# Patient Record
Sex: Female | Born: 1975 | Race: White | Hispanic: No | Marital: Married | Smoking: Never smoker
Health system: Southern US, Community
[De-identification: ages and names within clinical notes are randomized; demographics above are authoritative.]

## PROBLEM LIST (undated history)

## (undated) DIAGNOSIS — E611 Iron deficiency: Secondary | ICD-10-CM

## (undated) DIAGNOSIS — E349 Endocrine disorder, unspecified: Secondary | ICD-10-CM

## (undated) DIAGNOSIS — F419 Anxiety disorder, unspecified: Secondary | ICD-10-CM

## (undated) DIAGNOSIS — K219 Gastro-esophageal reflux disease without esophagitis: Secondary | ICD-10-CM

## (undated) DIAGNOSIS — G454 Transient global amnesia: Secondary | ICD-10-CM

## (undated) DIAGNOSIS — D649 Anemia, unspecified: Secondary | ICD-10-CM

## (undated) HISTORY — DX: Anemia, unspecified: D64.9

## (undated) HISTORY — DX: Gastro-esophageal reflux disease without esophagitis: K21.9

## (undated) HISTORY — PX: TONSILLECTOMY AND ADENOIDECTOMY: SHX28

## (undated) HISTORY — DX: Anxiety disorder, unspecified: F41.9

## (undated) HISTORY — DX: Endocrine disorder, unspecified: E34.9

## (undated) HISTORY — PX: OTHER SURGICAL HISTORY: SHX169

## (undated) HISTORY — DX: Transient global amnesia: G45.4

## (undated) HISTORY — DX: Iron deficiency: E61.1

---

## 2000-01-17 ENCOUNTER — Other Ambulatory Visit: Admission: RE | Admit: 2000-01-17 | Discharge: 2000-01-17 | Payer: Self-pay | Admitting: Internal Medicine

## 2001-03-07 ENCOUNTER — Other Ambulatory Visit: Admission: RE | Admit: 2001-03-07 | Discharge: 2001-03-07 | Payer: Self-pay | Admitting: *Deleted

## 2008-02-19 ENCOUNTER — Other Ambulatory Visit: Admission: RE | Admit: 2008-02-19 | Discharge: 2008-02-19 | Payer: Self-pay | Admitting: Obstetrics & Gynecology

## 2011-07-25 ENCOUNTER — Other Ambulatory Visit (HOSPITAL_COMMUNITY): Payer: Self-pay | Admitting: Family Medicine

## 2011-07-25 DIAGNOSIS — R140 Abdominal distension (gaseous): Secondary | ICD-10-CM

## 2011-07-25 DIAGNOSIS — R1011 Right upper quadrant pain: Secondary | ICD-10-CM

## 2011-08-02 ENCOUNTER — Encounter (HOSPITAL_COMMUNITY)
Admission: RE | Admit: 2011-08-02 | Discharge: 2011-08-02 | Disposition: A | Discharge status: Home / Self Care | Payer: Managed Care, Other (non HMO) | Source: Ambulatory Visit | Attending: Family Medicine | Admitting: Family Medicine

## 2011-08-02 DIAGNOSIS — R140 Abdominal distension (gaseous): Secondary | ICD-10-CM

## 2011-08-02 DIAGNOSIS — R142 Eructation: Secondary | ICD-10-CM | POA: Insufficient documentation

## 2011-08-02 DIAGNOSIS — R141 Gas pain: Secondary | ICD-10-CM | POA: Insufficient documentation

## 2011-08-02 DIAGNOSIS — R1011 Right upper quadrant pain: Secondary | ICD-10-CM

## 2011-08-02 MED ORDER — TECHNETIUM TC 99M MEBROFENIN IV KIT
5.3000 | PACK | Freq: Once | INTRAVENOUS | Status: AC | PRN
  Administered 2011-08-02: 5.3 via INTRAVENOUS

## 2011-08-02 MED ORDER — SINCALIDE 5 MCG IJ SOLR: 0.0200 ug/kg | Freq: Once | INTRAMUSCULAR | Status: DC

## 2013-02-25 ENCOUNTER — Ambulatory Visit: Payer: Self-pay

## 2013-03-03 ENCOUNTER — Ambulatory Visit: Payer: Managed Care, Other (non HMO) | Admitting: Nurse Practitioner

## 2013-03-05 ENCOUNTER — Encounter: Payer: Self-pay | Admitting: Nurse Practitioner

## 2013-03-05 ENCOUNTER — Ambulatory Visit (INDEPENDENT_AMBULATORY_CARE_PROVIDER_SITE_OTHER): Payer: Managed Care, Other (non HMO) | Admitting: Nurse Practitioner

## 2013-03-05 VITALS — BP 90/54 | HR 64 | Temp 98.4°F | Resp 16 | Ht 62.0 in | Wt 116.0 lb

## 2013-03-05 DIAGNOSIS — Z0000 Encounter for general adult medical examination without abnormal findings: Secondary | ICD-10-CM

## 2013-03-05 DIAGNOSIS — Z8639 Personal history of other endocrine, nutritional and metabolic disease: Secondary | ICD-10-CM

## 2013-03-05 DIAGNOSIS — Z862 Personal history of diseases of the blood and blood-forming organs and certain disorders involving the immune mechanism: Secondary | ICD-10-CM

## 2013-03-05 NOTE — Patient Instructions (Signed)
Our office will call you with lab results and any follow up. You look great! Pleasure to meet you!  Preventive Care for Adults, Female A healthy lifestyle and preventive care can promote health and wellness. Preventive health guidelines for women include the following key practices.  A routine yearly physical is a good way to check with your caregiver about your health and preventive screening. It is a chance to share any concerns and updates on your health, and to receive a thorough exam.  Visit your dentist for a routine exam and preventive care every 6 months. Brush your teeth twice a day and floss once a day. Good oral hygiene prevents tooth decay and gum disease.  The frequency of eye exams is based on your age, health, family medical history, use of contact lenses, and other factors. Follow your caregiver's recommendations for frequency of eye exams.  Eat a healthy diet. Foods like vegetables, fruits, whole grains, low-fat dairy products, and lean protein foods contain the nutrients you need without too many calories. Decrease your intake of foods high in solid fats, added sugars, and salt. Eat the right amount of calories for you.Get information about a proper diet from your caregiver, if necessary.  Regular physical exercise is one of the most important things you can do for your health. Most adults should get at least 150 minutes of moderate-intensity exercise (any activity that increases your heart rate and causes you to sweat) each week. In addition, most adults need muscle-strengthening exercises on 2 or more days a week.  Maintain a healthy weight. The body mass index (BMI) is a screening tool to identify possible weight problems. It provides an estimate of body fat based on height and weight. Your caregiver can help determine your BMI, and can help you achieve or maintain a healthy weight.For adults 20 years and older:  A BMI below 18.5 is considered underweight.  A BMI of 18.5 to  24.9 is normal.  A BMI of 25 to 29.9 is considered overweight.  A BMI of 30 and above is considered obese.  Maintain normal blood lipids and cholesterol levels by exercising and minimizing your intake of saturated fat. Eat a balanced diet with plenty of fruit and vegetables. Blood tests for lipids and cholesterol should begin at age 98 and be repeated every 5 years. If your lipid or cholesterol levels are high, you are over 50, or you are at high risk for heart disease, you may need your cholesterol levels checked more frequently.Ongoing high lipid and cholesterol levels should be treated with medicines if diet and exercise are not effective.  If you smoke, find out from your caregiver how to quit. If you do not use tobacco, do not start.  If you are pregnant, do not drink alcohol. If you are breastfeeding, be very cautious about drinking alcohol. If you are not pregnant and choose to drink alcohol, do not exceed 1 drink per day. One drink is considered to be 12 ounces (355 mL) of beer, 5 ounces (148 mL) of wine, or 1.5 ounces (44 mL) of liquor.  Avoid use of street drugs. Do not share needles with anyone. Ask for help if you need support or instructions about stopping the use of drugs.  High blood pressure causes heart disease and increases the risk of stroke. Your blood pressure should be checked at least every 1 to 2 years. Ongoing high blood pressure should be treated with medicines if weight loss and exercise are not effective.  If  you are 95 to 35 years old, ask your caregiver if you should take aspirin to prevent strokes.  Diabetes screening involves taking a blood sample to check your fasting blood sugar level. This should be done once every 3 years, after age 21, if you are within normal weight and without risk factors for diabetes. Testing should be considered at a younger age or be carried out more frequently if you are overweight and have at least 1 risk factor for diabetes.  Breast  cancer screening is essential preventive care for women. You should practice "breast self-awareness." This means understanding the normal appearance and feel of your breasts and may include breast self-examination. Any changes detected, no matter how small, should be reported to a caregiver. Women in their 77s and 30s should have a clinical breast exam (CBE) by a caregiver as part of a regular health exam every 1 to 3 years. After age 75, women should have a CBE every year. Starting at age 14, women should consider having a mammography (breast X-ray test) every year. Women who have a family history of breast cancer should talk to their caregiver about genetic screening. Women at a high risk of breast cancer should talk to their caregivers about having magnetic resonance imaging (MRI) and a mammography every year.  The Pap test is a screening test for cervical cancer. A Pap test can show cell changes on the cervix that might become cervical cancer if left untreated. A Pap test is a procedure in which cells are obtained and examined from the lower end of the uterus (cervix).  Women should have a Pap test starting at age 50.  Between ages 58 and 38, Pap tests should be repeated every 2 years.  Beginning at age 31, you should have a Pap test every 3 years as long as the past 3 Pap tests have been normal.  Some women have medical problems that increase the chance of getting cervical cancer. Talk to your caregiver about these problems. It is especially important to talk to your caregiver if a new problem develops soon after your last Pap test. In these cases, your caregiver may recommend more frequent screening and Pap tests.  The above recommendations are the same for women who have or have not gotten the vaccine for human papillomavirus (HPV).  If you had a hysterectomy for a problem that was not cancer or a condition that could lead to cancer, then you no longer need Pap tests. Even if you no longer need  a Pap test, a regular exam is a good idea to make sure no other problems are starting.  If you are between ages 28 and 43, and you have had normal Pap tests going back 10 years, you no longer need Pap tests. Even if you no longer need a Pap test, a regular exam is a good idea to make sure no other problems are starting.  If you have had past treatment for cervical cancer or a condition that could lead to cancer, you need Pap tests and screening for cancer for at least 20 years after your treatment.  If Pap tests have been discontinued, risk factors (such as a new sexual partner) need to be reassessed to determine if screening should be resumed.  The HPV test is an additional test that may be used for cervical cancer screening. The HPV test looks for the virus that can cause the cell changes on the cervix. The cells collected during the Pap test can  be tested for HPV. The HPV test could be used to screen women aged 21 years and older, and should be used in women of any age who have unclear Pap test results. After the age of 63, women should have HPV testing at the same frequency as a Pap test.  Colorectal cancer can be detected and often prevented. Most routine colorectal cancer screening begins at the age of 22 and continues through age 24. However, your caregiver may recommend screening at an earlier age if you have risk factors for colon cancer. On a yearly basis, your caregiver may provide home test kits to check for hidden blood in the stool. Use of a small camera at the end of a tube, to directly examine the colon (sigmoidoscopy or colonoscopy), can detect the earliest forms of colorectal cancer. Talk to your caregiver about this at age 43, when routine screening begins. Direct examination of the colon should be repeated every 5 to 10 years through age 58, unless early forms of pre-cancerous polyps or small growths are found.  Hepatitis C blood testing is recommended for all people born from [birth year]  through 1965 and any individual with known risks for hepatitis C.  Practice safe sex. Use condoms and avoid high-risk sexual practices to reduce the spread of sexually transmitted infections (STIs). STIs include gonorrhea, chlamydia, syphilis, trichomonas, herpes, HPV, and human immunodeficiency virus (HIV). Herpes, HIV, and HPV are viral illnesses that have no cure. They can result in disability, cancer, and death. Sexually active women aged 69 and younger should be checked for chlamydia. Older women with new or multiple partners should also be tested for chlamydia. Testing for other STIs is recommended if you are sexually active and at increased risk.  Osteoporosis is a disease in which the bones lose minerals and strength with aging. This can result in serious bone fractures. The risk of osteoporosis can be identified using a bone density scan. Women ages 71 and over and women at risk for fractures or osteoporosis should discuss screening with their caregivers. Ask your caregiver whether you should take a calcium supplement or vitamin D to reduce the rate of osteoporosis.  Menopause can be associated with physical symptoms and risks. Hormone replacement therapy is available to decrease symptoms and risks. You should talk to your caregiver about whether hormone replacement therapy is right for you.  Use sunscreen with sun protection factor (SPF) of 30 or more. Apply sunscreen liberally and repeatedly throughout the day. You should seek shade when your shadow is shorter than you. Protect yourself by wearing long sleeves, pants, a wide-brimmed hat, and sunglasses year round, whenever you are outdoors.  Once a month, do a whole body skin exam, using a mirror to look at the skin on your back. Notify your caregiver of new moles, moles that have irregular borders, moles that are larger than a pencil eraser, or moles that have changed in shape or color.  Stay current with required immunizations.  Influenza.  You need a dose every fall (or winter). The composition of the flu vaccine changes each year, so being vaccinated once is not enough.  Pneumococcal polysaccharide. You need 1 to 2 doses if you smoke cigarettes or if you have certain chronic medical conditions. You need 1 dose at age 42 (or older) if you have never been vaccinated.  Tetanus, diphtheria, pertussis (Tdap, Td). Get 1 dose of Tdap vaccine if you are younger than age 57, are over 47 and have contact with an infant, are  a Research scientist (physical sciences), are pregnant, or simply want to be protected from whooping cough. After that, you need a Td booster dose every 10 years. Consult your caregiver if you have not had at least 3 tetanus and diphtheria-containing shots sometime in your life or have a deep or dirty wound.  HPV. You need this vaccine if you are a woman age 4 or younger. The vaccine is given in 3 doses over 6 months.  Measles, mumps, rubella (MMR). You need at least 1 dose of MMR if you were born in 1957 or later. You may also need a second dose.  Meningococcal. If you are age 39 to 37 and a first-year college student living in a residence hall, or have one of several medical conditions, you need to get vaccinated against meningococcal disease. You may also need additional booster doses.  Zoster (shingles). If you are age 60 or older, you should get this vaccine.  Varicella (chickenpox). If you have never had chickenpox or you were vaccinated but received only 1 dose, talk to your caregiver to find out if you need this vaccine.  Hepatitis A. You need this vaccine if you have a specific risk factor for hepatitis A virus infection or you simply wish to be protected from this disease. The vaccine is usually given as 2 doses, 6 to 18 months apart.  Hepatitis B. You need this vaccine if you have a specific risk factor for hepatitis B virus infection or you simply wish to be protected from this disease. The vaccine is given in 3 doses, usually  over 6 months. Preventive Services / Frequency Ages 63 to 62  Blood pressure check.** / Every 1 to 2 years.  Lipid and cholesterol check.** / Every 5 years beginning at age 83.  Clinical breast exam.** / Every 3 years for women in their 36s and 30s.  Pap test.** / Every 2 years from ages 29 through 79. Every 3 years starting at age 61 through age 43 or 64 with a history of 3 consecutive normal Pap tests.  HPV screening.** / Every 3 years from ages 55 through ages 37 to 60 with a history of 3 consecutive normal Pap tests.  Hepatitis C blood test.** / For any individual with known risks for hepatitis C.  Skin self-exam. / Monthly.  Influenza immunization.** / Every year.  Pneumococcal polysaccharide immunization.** / 1 to 2 doses if you smoke cigarettes or if you have certain chronic medical conditions.  Tetanus, diphtheria, pertussis (Tdap, Td) immunization. / A one-time dose of Tdap vaccine. After that, you need a Td booster dose every 10 years.  HPV immunization. / 3 doses over 6 months, if you are 107 and younger.  Measles, mumps, rubella (MMR) immunization. / You need at least 1 dose of MMR if you were born in 1957 or later. You may also need a second dose.  Meningococcal immunization. / 1 dose if you are age 20 to 64 and a first-year college student living in a residence hall, or have one of several medical conditions, you need to get vaccinated against meningococcal disease. You may also need additional booster doses.  Varicella immunization.** / Consult your caregiver.  Hepatitis A immunization.** / Consult your caregiver. 2 doses, 6 to 18 months apart.  Hepatitis B immunization.** / Consult your caregiver. 3 doses usually over 6 months. Ages 69 to 67  Blood pressure check.** / Every 1 to 2 years.  Lipid and cholesterol check.** / Every 5 years beginning at age  20.  Clinical breast exam.** / Every year after age 30.  Mammogram.** / Every year beginning at age 32 and  continuing for as long as you are in good health. Consult with your caregiver.  Pap test.** / Every 3 years starting at age 35 through age 19 or 73 with a history of 3 consecutive normal Pap tests.  HPV screening.** / Every 3 years from ages 26 through ages 27 to 80 with a history of 3 consecutive normal Pap tests.  Fecal occult blood test (FOBT) of stool. / Every year beginning at age 7 and continuing until age 67. You may not need to do this test if you get a colonoscopy every 10 years.  Flexible sigmoidoscopy or colonoscopy.** / Every 5 years for a flexible sigmoidoscopy or every 10 years for a colonoscopy beginning at age 51 and continuing until age 36.  Hepatitis C blood test.** / For all people born from [birth year] through 1965 and any individual with known risks for hepatitis C.  Skin self-exam. / Monthly.  Influenza immunization.** / Every year.  Pneumococcal polysaccharide immunization.** / 1 to 2 doses if you smoke cigarettes or if you have certain chronic medical conditions.  Tetanus, diphtheria, pertussis (Tdap, Td) immunization.** / A one-time dose of Tdap vaccine. After that, you need a Td booster dose every 10 years.  Measles, mumps, rubella (MMR) immunization. / You need at least 1 dose of MMR if you were born in 1957 or later. You may also need a second dose.  Varicella immunization.** / Consult your caregiver.  Meningococcal immunization.** / Consult your caregiver.  Hepatitis A immunization.** / Consult your caregiver. 2 doses, 6 to 18 months apart.  Hepatitis B immunization.** / Consult your caregiver. 3 doses, usually over 6 months. Ages 54 and over  Blood pressure check.** / Every 1 to 2 years.  Lipid and cholesterol check.** / Every 5 years beginning at age 36.  Clinical breast exam.** / Every year after age 12.  Mammogram.** / Every year beginning at age 61 and continuing for as long as you are in good health. Consult with your caregiver.  Pap test.** / Every  3 years starting at age 86 through age 96 or 38 with a 3 consecutive normal Pap tests. Testing can be stopped between 65 and 70 with 3 consecutive normal Pap tests and no abnormal Pap or HPV tests in the past 10 years.  HPV screening.** / Every 3 years from ages 16 through ages 7 or 18 with a history of 3 consecutive normal Pap tests. Testing can be stopped between 65 and 70 with 3 consecutive normal Pap tests and no abnormal Pap or HPV tests in the past 10 years.  Fecal occult blood test (FOBT) of stool. / Every year beginning at age 2 and continuing until age 82. You may not need to do this test if you get a colonoscopy every 10 years.  Flexible sigmoidoscopy or colonoscopy.** / Every 5 years for a flexible sigmoidoscopy or every 10 years for a colonoscopy beginning at age 52 and continuing until age 5.  Hepatitis C blood test.** / For all people born from [birth year] through 1965 and any individual with known risks for hepatitis C.  Osteoporosis screening.** / A one-time screening for women ages 33 and over and women at risk for fractures or osteoporosis.  Skin self-exam. / Monthly.  Influenza immunization.** / Every year.  Pneumococcal polysaccharide immunization.** / 1 dose at age 29 (or older) if you have never  been vaccinated.  Tetanus, diphtheria, pertussis (Tdap, Td) immunization. / A one-time dose of Tdap vaccine if you are over 65 and have contact with an infant, are a Research scientist (physical sciences), or simply want to be protected from whooping cough. After that, you need a Td booster dose every 10 years.  Varicella immunization.** / Consult your caregiver.  Meningococcal immunization.** / Consult your caregiver.  Hepatitis A immunization.** / Consult your caregiver. 2 doses, 6 to 18 months apart.  Hepatitis B immunization.** / Check with your caregiver. 3 doses, usually over 6 months. ** Family history and personal history of risk and conditions may change your caregiver's  recommendations. Document Released: 06/19/2001 Document Revised: 07/16/2011 Document Reviewed: 09/18/2010 [facility] Patient Information 2014 [place], [place].

## 2013-03-06 ENCOUNTER — Encounter: Payer: Self-pay | Admitting: Nurse Practitioner

## 2013-03-08 ENCOUNTER — Encounter: Payer: Self-pay | Admitting: Nurse Practitioner

## 2013-03-08 NOTE — Progress Notes (Signed)
Subjective:     Natalie Park is a 37 y.o. female and is here for a comprehensive physical exam. Historically, she has been treated for GERD using nexium, had GI work up; iron deficiency; and infertility. Currently, she is not taking any prescription meds. She reports indigestion about 2/mo., uses essential oils, and enzymes such as papaya with relief. The patient reports L foot pain & bilateral bunions for which she is seeing a podiatrist; and occasional back pain relieved by NSAIDS & stretches..   History   Social History  . Marital Status: Married    Spouse Name: N/A    Number of Children: 2  . Years of Education: N/A   Occupational History  . personal trainer    Social History Main Topics  . Smoking status: Never Smoker   . Smokeless tobacco: Not on file  . Alcohol Use: 0.6 oz/week    1 Glasses of wine per week  . Drug Use: No  . Sexual Activity: Yes   Other Topics Concern  . Not on file   Social History Narrative  . No narrative on file   No health maintenance topics applied.  The following portions of the patient's history were reviewed and updated as appropriate: allergies, current medications, past family history, past medical history, past social history, past surgical history and problem list.  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 Behavioral/Psych: negative for excessive alcohol consumption, sleep  disturbance and tobacco use Allergic/Immunologic: negative   Objective:    BP 90/54  Pulse 64  Temp(Src) 98.4 F (36.9 C) (Oral)  Resp 16  Ht 5\' 2"  (1.575 m)  Wt 116 lb (52.617 kg)  BMI 21.21 kg/m2  SpO2 100%  LMP 02/21/2013 General appearance: alert, cooperative, appears stated age and no distress Head: Normocephalic, without obvious abnormality, atraumatic Eyes: negative findings: lids and lashes normal, conjunctivae and sclerae normal, corneas clear and pupils equal, round, reactive to light and accomodation Ears: normal TM's and external ear canals both ears Throat: lips, mucosa, and tongue normal; teeth and gums normal Neck: no adenopathy, no carotid bruit, supple, symmetrical, trachea midline and thyroid not enlarged, symmetric, no tenderness/mass/nodules Back: symmetric, no curvature. ROM normal. No CVA tenderness., c/o spinal tenderness to mild palpation in thoracic spine. Lungs: clear to auscultation bilaterally Heart: regular rate and rhythm, S1, S2 normal, no murmur, click, rub or gallop Abdomen: soft, non-tender; bowel sounds normal; no masses,  no organomegaly Extremities: extremities normal, atraumatic, no cyanosis or edema, no edema, redness or tenderness in the calves or thighs and L foot taped by podiatrist to take pressure off sessamoid bone. Pulses: 2+ and symmetric Skin: Skin color, texture, turgor normal. No rashes or lesions Lymph nodes: Cervical, supraclavicular, and axillary nodes normal. Neurologic: Alert and oriented X 3, normal strength and tone. Normal symmetric reflexes. Normal coordination and gait    Assessment:    Healthy female exam. Prev care: Last Pap 05/2011. No abnmls. Next 2016. Vaccines: pt declines flu & Tdap. Gets reg exercise, eats healthy diet Occasional back pain relieved by Nsaids & stretches, point tenderness on exam. Hx iron deficiency  Hx GERD relieved by using essential oils & papaya enzymes. Discussed using fresh pineapple, as well,  to aid in protein catabolism     Plan:    screening & baseline labs as ordered. See After Visit Summary for Counseling Recommendations

## 2013-03-12 ENCOUNTER — Other Ambulatory Visit: Payer: Self-pay

## 2013-03-13 ENCOUNTER — Ambulatory Visit (INDEPENDENT_AMBULATORY_CARE_PROVIDER_SITE_OTHER): Payer: Managed Care, Other (non HMO)

## 2013-03-13 DIAGNOSIS — M2580 Other specified joint disorders, unspecified joint: Secondary | ICD-10-CM

## 2013-03-13 DIAGNOSIS — M7750 Other enthesopathy of unspecified foot: Secondary | ICD-10-CM

## 2013-03-13 DIAGNOSIS — M19071 Primary osteoarthritis, right ankle and foot: Secondary | ICD-10-CM

## 2013-03-13 DIAGNOSIS — M722 Plantar fascial fibromatosis: Secondary | ICD-10-CM

## 2013-03-13 DIAGNOSIS — M948X9 Other specified disorders of cartilage, unspecified sites: Secondary | ICD-10-CM

## 2013-03-13 DIAGNOSIS — M19079 Primary osteoarthritis, unspecified ankle and foot: Secondary | ICD-10-CM

## 2013-03-13 DIAGNOSIS — M778 Other enthesopathies, not elsewhere classified: Secondary | ICD-10-CM

## 2013-03-13 NOTE — Progress Notes (Signed)
  Subjective:    Patient ID: Natalie Park, female    DOB: 04-22-1976, 37 y.o.   MRN: [id number]  HPI my left foot is about the same and hurts at the big toe bone  Patient is been wearing the dancers pads with some success continues to pain of the fibular sesamoid left foot. Also is having some pain on the right first MTP area secondary bunion deformity. May also have some myofascial pain with activities. Possible mild plantar fasciitis  Review of Systems  Constitutional: Negative.   HENT: Negative.   Eyes: Negative.   Respiratory: Negative.   Cardiovascular: Negative.   Gastrointestinal: Negative.   Endocrine: Negative.   Genitourinary: Negative.   Musculoskeletal: Positive for back pain.  Skin: Negative.   Allergic/Immunologic: Positive for food allergies.       Dairy products   Neurological: Negative.   Hematological: Negative.   Psychiatric/Behavioral: Negative.        Objective:   Physical Exam Neurovascular status unchanged pedal pulses palpable epicritic and proprioceptive sensations intact. 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 Patient will also continue using Mobic. Orthotics skin carried at this time for functional orthotic with pocket first metatarsal bilateral be contacted in 3-4 weeks for orthotic fitting when ready maintain heat and ice therapy and moderate activity levels in the interim. Functional orthotic scan carried out at this time next  Alvan Dame DPM

## 2013-03-13 NOTE — Patient Instructions (Signed)

## 2013-03-20 ENCOUNTER — Ambulatory Visit (INDEPENDENT_AMBULATORY_CARE_PROVIDER_SITE_OTHER): Payer: Managed Care, Other (non HMO)

## 2013-03-20 ENCOUNTER — Other Ambulatory Visit: Payer: Self-pay | Admitting: *Deleted

## 2013-03-20 ENCOUNTER — Other Ambulatory Visit: Payer: Self-pay | Admitting: Nurse Practitioner

## 2013-03-20 ENCOUNTER — Ambulatory Visit: Payer: Managed Care, Other (non HMO) | Admitting: *Deleted

## 2013-03-20 DIAGNOSIS — Z23 Encounter for immunization: Secondary | ICD-10-CM

## 2013-03-20 DIAGNOSIS — Z8639 Personal history of other endocrine, nutritional and metabolic disease: Secondary | ICD-10-CM

## 2013-03-20 DIAGNOSIS — Z0000 Encounter for general adult medical examination without abnormal findings: Secondary | ICD-10-CM

## 2013-03-20 LAB — IRON AND TIBC
%SAT: 42 % (ref 20–55)
TIBC: 306 ug/dL (ref 250–470)
UIBC: 178 ug/dL (ref 125–400)

## 2013-03-20 LAB — HEPATIC FUNCTION PANEL
ALT: 10 U/L (ref 0–35)
Alkaline Phosphatase: 47 U/L (ref 39–117)
Bilirubin, Direct: 0.1 mg/dL (ref 0.0–0.3)
Indirect Bilirubin: 0.3 mg/dL (ref 0.0–0.9)
Total Bilirubin: 0.4 mg/dL (ref 0.3–1.2)
Total Protein: 6.8 g/dL (ref 6.0–8.3)

## 2013-03-20 LAB — RENAL FUNCTION PANEL
Albumin: 4.4 g/dL (ref 3.5–5.2)
Chloride: 104 mEq/L (ref 96–112)
Creat: 0.79 mg/dL (ref 0.50–1.10)
Phosphorus: 3.5 mg/dL (ref 2.3–4.6)
Potassium: 4.4 mEq/L (ref 3.5–5.3)

## 2013-03-20 LAB — CBC
HCT: 37.2 % (ref 36.0–46.0)
Hemoglobin: 12.7 g/dL (ref 12.0–15.0)
MCH: 31.9 pg (ref 26.0–34.0)
MCHC: 34.1 g/dL (ref 30.0–36.0)
MCV: 93.5 fL (ref 78.0–100.0)
RBC: 3.98 MIL/uL (ref 3.87–5.11)
RDW: 13.5 % (ref 11.5–15.5)
WBC: 8.1 10*3/uL (ref 4.0–10.5)

## 2013-03-20 LAB — LIPID PANEL
LDL Cholesterol: 89 mg/dL (ref 0–99)
Triglycerides: 40 mg/dL (ref <150)
VLDL: 8 mg/dL (ref 0–40)

## 2013-03-20 MED ORDER — TETANUS-DIPHTH-ACELL PERTUSSIS 5-2.5-18.5 LF-MCG/0.5 IM SUSP: 0.5000 mL | Freq: Once | INTRAMUSCULAR | Status: DC

## 2013-03-21 LAB — URINALYSIS, ROUTINE W REFLEX MICROSCOPIC
Hgb urine dipstick: NEGATIVE
Ketones, ur: NEGATIVE mg/dL
Leukocytes, UA: NEGATIVE
Nitrite: NEGATIVE
Specific Gravity, Urine: 1.021 (ref 1.005–1.030)
Urobilinogen, UA: 0.2 mg/dL (ref 0.0–1.0)

## 2013-03-21 LAB — FERRITIN: Ferritin: 11 ng/mL (ref 10–291)

## 2013-03-21 LAB — TSH: TSH: 1.207 u[IU]/mL (ref 0.350–4.500)

## 2013-03-23 ENCOUNTER — Telehealth: Payer: Self-pay | Admitting: Nurse Practitioner

## 2013-03-23 NOTE — Telephone Encounter (Signed)
All labs look great! No iron deficiency. VitD level is 70-she should take D3 1000 iu 3-4 times weekly, does not need daily dose.

## 2013-03-24 NOTE — Telephone Encounter (Signed)
Patient notified of results. Patient voiced understanding. 

## 2013-04-10 ENCOUNTER — Ambulatory Visit (INDEPENDENT_AMBULATORY_CARE_PROVIDER_SITE_OTHER): Payer: Managed Care, Other (non HMO)

## 2013-04-10 VITALS — BP 100/57 | HR 61 | Resp 18

## 2013-04-10 DIAGNOSIS — M722 Plantar fascial fibromatosis: Secondary | ICD-10-CM

## 2013-04-10 DIAGNOSIS — M7750 Other enthesopathy of unspecified foot: Secondary | ICD-10-CM

## 2013-04-10 DIAGNOSIS — M948X9 Other specified disorders of cartilage, unspecified sites: Secondary | ICD-10-CM

## 2013-04-10 DIAGNOSIS — M778 Other enthesopathies, not elsewhere classified: Secondary | ICD-10-CM

## 2013-04-10 DIAGNOSIS — M2580 Other specified joint disorders, unspecified joint: Secondary | ICD-10-CM

## 2013-04-10 NOTE — Progress Notes (Signed)
   Subjective:    Patient ID: Natalie Park, female    DOB: Apr 16, 1976, 37 y.o.   MRN: [id number]  HPI I am here to get my inserts and my feet hurt a little    Review of Systems deferred at this visit     Objective:   Physical Exam Neurovascular status intact no changes in history or exam pedal pulses palpable. At this time patient is dispensed 1 pair of orthotics with kinetic wedge modification pocketing of the first metatarsal. Patient has a history of fasciitis and sesamoiditis we'll monitor for improvement over the 3 months. Written instructions for orthotic use and break her dispensed at this time maintain NSAID if needed for pain       Assessment & Plan:  Assessment sesamoiditis and plantar fasciitis with capsulitis first MTP area. Dispensed orthotics with kinetic wedge type modification. Pocketing first metatarsal bilateral. Written instructions are given followup in 2 months if needed  Alvan Dame DPM

## 2013-04-10 NOTE — Patient Instructions (Signed)

## 2013-05-22 ENCOUNTER — Telehealth: Payer: Self-pay | Admitting: *Deleted

## 2013-05-22 NOTE — Telephone Encounter (Signed)
Left message for patient to return call.

## 2013-05-22 NOTE — Telephone Encounter (Signed)
Patient left vm requesting a call back concerning lab results. Patient would like to know her cholesterol numbers.

## 2013-05-28 ENCOUNTER — Encounter: Payer: Self-pay | Admitting: *Deleted

## 2013-05-28 NOTE — Telephone Encounter (Signed)
Mailed results to patient's home address

## 2013-09-29 IMAGING — NM NM HEPATO W/GB/PHARM/[PERSON_NAME]
2 series · 12 of 12 positions shown · non-contrast
Comparison: None

CLINICAL DATA: Intermittent abdominal pain, right upper quadrant
pain, bloating and pressure for 2 months, normal ultrasound

NUCLEAR MEDICINE HEPATOBILIARY IMAGING WITH GALLBLADDER EF:
TECHNIQUE: Sequential images of the abdomen were obtained for 60
minutes following intravenous administration of
radiopharmaceutical.  Patient then received an infusion of
ugm/kg of CCK analog intravenously over 30 minutes, and imaging was
continued for 30 minutes.  A time-activity curve was generated from
tracer within the gallbladder following CCK administration, and the
gallbladder ejection fraction was calculated.
Radiopharmaceutical:  5.3 mCi Ec-BBm mebrofenin
Pharmaceutical:  1 mcg CCK

[Series 1: gb hepatobiliary scan · 4.75mm/px · 6 of 60 frames shown (1 of 2)]
[frame 6/60]
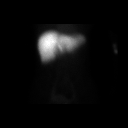
[frame 16/60]
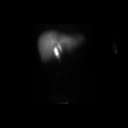
[frame 26/60]
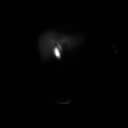
[frame 36/60]
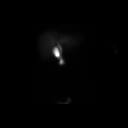
[frame 46/60]
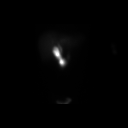
[frame 56/60]
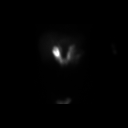

[Series 1: gb hepatobiliary scan · 4.75mm/px · 6 of 30 frames shown (2 of 2)]
[frame 3/30]
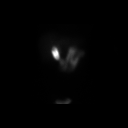
[frame 8/30]
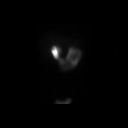
[frame 13/30]
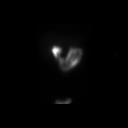
[frame 18/30]
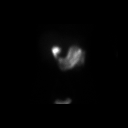
[frame 23/30]
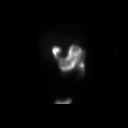
[frame 28/30]
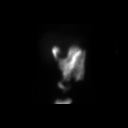

[12 of 12 positions shown; findings below may reference images not displayed]

FINDINGS: Prompt tracer extraction from bloodstream, indicating normal
hepatocellular function.
Prompt excretion of tracer into biliary tree.
Gallbladder visualized at 12 minutes.
Small bowel visualized at 33 minutes.
No hepatic retention of tracer.

Subjectively normal emptying of tracer from gallbladder following
CCK stimulation.
Calculated gallbladder ejection fraction is 75%, normal.
Patient experienced no symptoms following CCK administration.
IMPRESSION: Normal exam.

Normal values for gallbladder ejection fraction:
> 30% for exams utilizing sincalide (CCK)
> 50% for exams utilizing fatty meal stimulation

## 2014-04-16 ENCOUNTER — Encounter: Payer: Self-pay | Admitting: Nurse Practitioner

## 2014-04-16 ENCOUNTER — Ambulatory Visit (INDEPENDENT_AMBULATORY_CARE_PROVIDER_SITE_OTHER): Payer: Managed Care, Other (non HMO) | Admitting: Nurse Practitioner

## 2014-04-16 VITALS — BP 88/56 | HR 79 | Temp 98.6°F | Ht 62.0 in | Wt 115.0 lb

## 2014-04-16 DIAGNOSIS — J069 Acute upper respiratory infection, unspecified: Secondary | ICD-10-CM

## 2014-04-16 NOTE — Progress Notes (Signed)
   Subjective:    Patient ID: Natalie Park, female    DOB: 11-25-1975, 38 y.o.   MRN: [id number]  Cough This is a new problem. The current episode started 1 to 4 weeks ago (1 week). The problem has been unchanged. The cough is productive of sputum. Associated symptoms include chest pain (substernal burning pain w/cough), ear congestion, a fever (resolved), nasal congestion and postnasal drip. Pertinent negatives include no ear pain, headaches, sore throat, shortness of breath or wheezing. The symptoms are aggravated by lying down. Treatments tried: basil for ears. The treatment provided mild relief.      Review of Systems  Constitutional: Positive for fever (resolved).  HENT: Positive for congestion, postnasal drip and sinus pressure. Negative for ear pain and sore throat.   Respiratory: Positive for cough. Negative for chest tightness, shortness of breath and wheezing.   Cardiovascular: Positive for chest pain (substernal burning pain w/cough).  Neurological: Negative for headaches.       Objective:   Physical Exam  Constitutional: She is oriented to person, place, and time. She appears well-developed and well-nourished. No distress.  HENT:  Head: Normocephalic and atraumatic.  Right Ear: External ear normal.  Left Ear: External ear normal.  Mouth/Throat: Oropharynx is clear and moist. No oropharyngeal exudate.  Eyes: Conjunctivae are normal. Right eye exhibits no discharge. Left eye exhibits no discharge.  Neck: Normal range of motion. Neck supple. No thyromegaly present.  Cardiovascular: Normal rate, regular rhythm and normal heart sounds.   No murmur heard. Pulmonary/Chest: Effort normal and breath sounds normal. No respiratory distress. She has no wheezes. She has no rales.  Frequent coughing during exam   Lymphadenopathy:    She has no cervical adenopathy.  Neurological: She is alert and oriented to person, place, and time.  Skin: Skin is warm and dry.    Psychiatric: She has a normal mood and affect. Her behavior is normal. Thought content normal.  Vitals reviewed.         Assessment & Plan:  1. Upper respiratory infection with cough and congestion Symptom management Use sinus rinse pseudopehedrine See patient instructions for complete plan. F/u PRN fever, chest pain w/inspiration

## 2014-04-16 NOTE — Progress Notes (Signed)
Pre visit review using our clinic review tool, if applicable. No additional management support is needed unless otherwise documented below in the visit note. 

## 2014-04-16 NOTE — Patient Instructions (Signed)
You have a virus causing your symptoms. The average duration of symptoms is 14 days. Start daily sinus rinses (neilmed Sinus Rinse). Use 30 mg to 60 mg pseudoephedrine 2 to 3 times daily for sinus congestion & fluid in ears, if desired. Sip fluids every hour. Rest. If you are not feeling better in 10 days or develop fever or chest pain, call us for re-evaluation. Feel better!  Upper Respiratory Infection, Adult An upper respiratory infection (URI) is also sometimes known as the common cold. The upper respiratory tract includes the nose, sinuses, throat, trachea, and bronchi. Bronchi are the airways leading to the lungs. Most people improve within 1 week, but symptoms can last up to 2 weeks. A residual cough may last even longer.  CAUSES Many different viruses can infect the tissues lining the upper respiratory tract. The tissues become irritated and inflamed and often become very moist. Mucus production is also common. A cold is contagious. You can easily spread the virus to others by oral contact. This includes kissing, sharing a glass, coughing, or sneezing. Touching your mouth or nose and then touching a surface, which is then touched by another person, can also spread the virus. SYMPTOMS  Symptoms typically develop 1 to 3 days after you come in contact with a cold virus. Symptoms vary from person to person. They may include:  Runny nose.  Sneezing.  Nasal congestion.  Sinus irritation.  Sore throat.  Loss of voice (laryngitis).  Cough.  Fatigue.  Muscle aches.  Loss of appetite.  Headache.  Low-grade fever. DIAGNOSIS  You might diagnose your own cold based on familiar symptoms, since most people get a cold 2 to 3 times a year. Your caregiver can confirm this based on your exam. Most importantly, your caregiver can check that your symptoms are not due to another disease such as strep throat, sinusitis, pneumonia, asthma, or epiglottitis. Blood tests, throat tests, and X-rays are  not necessary to diagnose a common cold, but they may sometimes be helpful in excluding other more serious diseases. Your caregiver will decide if any further tests are required. RISKS AND COMPLICATIONS  You may be at risk for a more severe case of the common cold if you smoke cigarettes, have chronic heart disease (such as heart failure) or lung disease (such as asthma), or if you have a weakened immune system. The very young and very old are also at risk for more serious infections. Bacterial sinusitis, middle ear infections, and bacterial pneumonia can complicate the common cold. The common cold can worsen asthma and chronic obstructive pulmonary disease (COPD). Sometimes, these complications can require emergency medical care and may be life-threatening. PREVENTION  The best way to protect against getting a cold is to practice good hygiene. Avoid oral or hand contact with people with cold symptoms. Wash your hands often if contact occurs. There is no clear evidence that vitamin C, vitamin E, echinacea, or exercise reduces the chance of developing a cold. However, it is always recommended to get plenty of rest and practice good nutrition. TREATMENT  Treatment is directed at relieving symptoms. There is no cure. Antibiotics are not effective, because the infection is caused by a virus, not by bacteria. Treatment may include:  Increased fluid intake. Sports drinks offer valuable electrolytes, sugars, and fluids.  Breathing heated mist or steam (vaporizer or shower).  Eating chicken soup or other clear broths, and maintaining good nutrition.  Getting plenty of rest.  Using gargles or lozenges for comfort.  Controlling fevers  with ibuprofen or acetaminophen as directed by your caregiver.  Increasing usage of your inhaler if you have asthma. Zinc gel and zinc lozenges, taken in the first 24 hours of the common cold, can shorten the duration and lessen the severity of symptoms. Pain medicines may  help with fever, muscle aches, and throat pain. A variety of non-prescription medicines are available to treat congestion and runny nose. Your caregiver can make recommendations and may suggest nasal or lung inhalers for other symptoms.  HOME CARE INSTRUCTIONS   Only take over-the-counter or prescription medicines for pain, discomfort, or fever as directed by your caregiver.  Use a warm mist humidifier or inhale steam from a shower to increase air moisture. This may keep secretions moist and make it easier to breathe.  Drink enough water and fluids to keep your urine clear or pale yellow.  Rest as needed.  Return to work when your temperature has returned to normal or as your caregiver advises. You may need to stay home longer to avoid infecting others. You can also use a face mask and careful hand washing to prevent spread of the virus. SEEK MEDICAL CARE IF:   After the first few days, you feel you are getting worse rather than better.  You need your caregiver's advice about medicines to control symptoms.  You develop chills, worsening shortness of breath, or brown or red sputum. These may be signs of pneumonia.  You develop yellow or brown nasal discharge or pain in the face, especially when you bend forward. These may be signs of sinusitis.  You develop a fever, swollen neck glands, pain with swallowing, or white areas in the back of your throat. These may be signs of strep throat. SEEK IMMEDIATE MEDICAL CARE IF:   You have a fever.  You develop severe or persistent headache, ear pain, sinus pain, or chest pain.  You develop wheezing, a prolonged cough, cough up blood, or have a change in your usual mucus (if you have chronic lung disease).  You develop sore muscles or a stiff neck. Document Released: 10/17/2000 Document Revised: 07/16/2011 Document Reviewed: 08/25/2010 [facility] [employer] Patient Information 2014 [employer] [place].

## 2015-06-02 ENCOUNTER — Other Ambulatory Visit (HOSPITAL_COMMUNITY)
Admission: RE | Admit: 2015-06-02 | Discharge: 2015-06-02 | Disposition: A | Discharge status: Home / Self Care | Payer: Managed Care, Other (non HMO) | Source: Ambulatory Visit | Attending: Family Medicine | Admitting: Family Medicine

## 2015-06-02 ENCOUNTER — Encounter: Payer: Self-pay | Admitting: Family Medicine

## 2015-06-02 ENCOUNTER — Ambulatory Visit (INDEPENDENT_AMBULATORY_CARE_PROVIDER_SITE_OTHER): Payer: Managed Care, Other (non HMO) | Admitting: Family Medicine

## 2015-06-02 VITALS — BP 96/63 | HR 62 | Temp 98.3°F | Resp 20 | Ht 62.0 in | Wt 121.5 lb

## 2015-06-02 DIAGNOSIS — Z0000 Encounter for general adult medical examination without abnormal findings: Secondary | ICD-10-CM | POA: Diagnosis not present

## 2015-06-02 DIAGNOSIS — Z130 Encounter for screening for diseases of the blood and blood-forming organs and certain disorders involving the immune mechanism: Secondary | ICD-10-CM

## 2015-06-02 DIAGNOSIS — Z124 Encounter for screening for malignant neoplasm of cervix: Secondary | ICD-10-CM

## 2015-06-02 DIAGNOSIS — Z13220 Encounter for screening for lipoid disorders: Secondary | ICD-10-CM | POA: Diagnosis not present

## 2015-06-02 DIAGNOSIS — Z131 Encounter for screening for diabetes mellitus: Secondary | ICD-10-CM | POA: Diagnosis not present

## 2015-06-02 DIAGNOSIS — Z01419 Encounter for gynecological examination (general) (routine) without abnormal findings: Secondary | ICD-10-CM | POA: Insufficient documentation

## 2015-06-02 DIAGNOSIS — Z1329 Encounter for screening for other suspected endocrine disorder: Secondary | ICD-10-CM

## 2015-06-02 DIAGNOSIS — N926 Irregular menstruation, unspecified: Secondary | ICD-10-CM

## 2015-06-02 LAB — LIPID PANEL
Cholesterol: 150 mg/dL (ref 0–200)
HDL: 70.3 mg/dL (ref >39.00)
LDL Cholesterol: 70 mg/dL (ref 0–99)
NONHDL: 79.54
Total CHOL/HDL Ratio: 2
Triglycerides: 49 mg/dL (ref 0.0–149.0)
VLDL: 9.8 mg/dL (ref 0.0–40.0)

## 2015-06-02 LAB — COMPREHENSIVE METABOLIC PANEL
ALK PHOS: 46 U/L (ref 39–117)
ALT: 9 U/L (ref 0–35)
AST: 18 U/L (ref 0–37)
Albumin: 4.2 g/dL (ref 3.5–5.2)
BILIRUBIN TOTAL: 0.3 mg/dL (ref 0.2–1.2)
BUN: 13 mg/dL (ref 6–23)
CO2: 31 mEq/L (ref 19–32)
CREATININE: 0.78 mg/dL (ref 0.40–1.20)
Calcium: 9.1 mg/dL (ref 8.4–10.5)
Chloride: 102 mEq/L (ref 96–112)
GFR: 88.16 mL/min (ref >60.00)
GLUCOSE: 81 mg/dL (ref 70–99)
Potassium: 4.3 mEq/L (ref 3.5–5.1)
Sodium: 138 mEq/L (ref 135–145)
TOTAL PROTEIN: 6.2 g/dL (ref 6.0–8.3)

## 2015-06-02 LAB — CBC WITH DIFFERENTIAL/PLATELET
BASOS ABS: 0 10*3/uL (ref 0.0–0.1)
Basophils Relative: 0.6 % (ref 0.0–3.0)
Eosinophils Absolute: 0.1 10*3/uL (ref 0.0–0.7)
Eosinophils Relative: 2.9 % (ref 0.0–5.0)
HEMATOCRIT: 38.9 % (ref 36.0–46.0)
Hemoglobin: 12.9 g/dL (ref 12.0–15.0)
Lymphocytes Relative: 30.1 % (ref 12.0–46.0)
Lymphs Abs: 1.3 10*3/uL (ref 0.7–4.0)
MCHC: 33.2 g/dL (ref 30.0–36.0)
MCV: 96 fl (ref 78.0–100.0)
MONOS PCT: 7.3 % (ref 3.0–12.0)
Monocytes Absolute: 0.3 10*3/uL (ref 0.1–1.0)
NEUTROS PCT: 59.1 % (ref 43.0–77.0)
Neutro Abs: 2.6 10*3/uL (ref 1.4–7.7)
Platelets: 284 10*3/uL (ref 150.0–400.0)
RBC: 4.05 Mil/uL (ref 3.87–5.11)
RDW: 13.8 % (ref 11.5–15.5)
WBC: 4.4 10*3/uL (ref 4.0–10.5)

## 2015-06-02 LAB — TSH: TSH: 1.07 u[IU]/mL (ref 0.35–4.50)

## 2015-06-02 NOTE — Patient Instructions (Signed)
Health Maintenance, Female Adopting a healthy lifestyle and getting preventive care can go a long way to promote health and wellness. Talk with your health care provider about what schedule of regular examinations is right for you. This is a good chance for you to check in with your provider about disease prevention and staying healthy. In between checkups, there are plenty of things you can do on your own. Experts have done a lot of research about which lifestyle changes and preventive measures are most likely to keep you healthy. Ask your health care provider for more information. WEIGHT AND DIET  Eat a healthy diet  Be sure to include plenty of vegetables, fruits, low-fat dairy products, and lean protein.  Do not eat a lot of foods high in solid fats, added sugars, or salt.  Get regular exercise. This is one of the most important things you can do for your health.  Most adults should exercise for at least 150 minutes each week. The exercise should increase your heart rate and make you sweat (moderate-intensity exercise).  Most adults should also do strengthening exercises at least twice a week. This is in addition to the moderate-intensity exercise.  Maintain a healthy weight  Body mass index (BMI) is a measurement that can be used to identify possible weight problems. It estimates body fat based on height and weight. Your health care provider can help determine your BMI and help you achieve or maintain a healthy weight.  For females 20 years of age and older:   A BMI below 18.5 is considered underweight.  A BMI of 18.5 to 24.9 is normal.  A BMI of 25 to 29.9 is considered overweight.  A BMI of 30 and above is considered obese.  Watch levels of cholesterol and blood lipids  You should start having your blood tested for lipids and cholesterol at 38 years of age, then have this test every 5 years.  You may need to have your cholesterol levels checked more often if:  Your lipid  or cholesterol levels are high.  You are older than 38 years of age.  You are at high risk for heart disease.  CANCER SCREENING   Lung Cancer  Lung cancer screening is recommended for adults 55-80 years old who are at high risk for lung cancer because of a history of smoking.  A yearly low-dose CT scan of the lungs is recommended for people who:  Currently smoke.  Have quit within the past 15 years.  Have at least a 30-pack-year history of smoking. A pack year is smoking an average of one pack of cigarettes a day for 1 year.  Yearly screening should continue until it has been 15 years since you quit.  Yearly screening should stop if you develop a health problem that would prevent you from having lung cancer treatment.  Breast Cancer  Practice breast self-awareness. This means understanding how your breasts normally appear and feel.  It also means doing regular breast self-exams. Let your health care provider know about any changes, no matter how small.  If you are in your 20s or 30s, you should have a clinical breast exam (CBE) by a health care provider every 1-3 years as part of a regular health exam.  If you are 40 or older, have a CBE every year. Also consider having a breast X-ray (mammogram) every year.  If you have a family history of breast cancer, talk to your health care provider about genetic screening.  If you   are at high risk for breast cancer, talk to your health care provider about having an MRI and a mammogram every year.  Breast cancer gene (BRCA) assessment is recommended for women who have family members with BRCA-related cancers. BRCA-related cancers include:  Breast.  Ovarian.  Tubal.  Peritoneal cancers.  Results of the assessment will determine the need for genetic counseling and BRCA1 and BRCA2 testing. Cervical Cancer Your health care provider may recommend that you be screened regularly for cancer of the pelvic organs (ovaries, uterus, and  vagina). This screening involves a pelvic examination, including checking for microscopic changes to the surface of your cervix (Pap test). You may be encouraged to have this screening done every 3 years, beginning at age 21.  For women ages 30-65, health care providers may recommend pelvic exams and Pap testing every 3 years, or they may recommend the Pap and pelvic exam, combined with testing for human papilloma virus (HPV), every 5 years. Some types of HPV increase your risk of cervical cancer. Testing for HPV may also be done on women of any age with unclear Pap test results.  Other health care providers may not recommend any screening for nonpregnant women who are considered low risk for pelvic cancer and who do not have symptoms. Ask your health care provider if a screening pelvic exam is right for you.  If you have had past treatment for cervical cancer or a condition that could lead to cancer, you need Pap tests and screening for cancer for at least 20 years after your treatment. If Pap tests have been discontinued, your risk factors (such as having a new sexual partner) need to be reassessed to determine if screening should resume. Some women have medical problems that increase the chance of getting cervical cancer. In these cases, your health care provider may recommend more frequent screening and Pap tests. Colorectal Cancer  This type of cancer can be detected and often prevented.  Routine colorectal cancer screening usually begins at 38 years of age and continues through 38 years of age.  Your health care provider may recommend screening at an earlier age if you have risk factors for colon cancer.  Your health care provider may also recommend using home test kits to check for hidden blood in the stool.  A small camera at the end of a tube can be used to examine your colon directly (sigmoidoscopy or colonoscopy). This is done to check for the earliest forms of colorectal  cancer.  Routine screening usually begins at age 50.  Direct examination of the colon should be repeated every 5-10 years through 38 years of age. However, you may need to be screened more often if early forms of precancerous polyps or small growths are found. Skin Cancer  Check your skin from head to toe regularly.  Tell your health care provider about any new moles or changes in moles, especially if there is a change in a mole's shape or color.  Also tell your health care provider if you have a mole that is larger than the size of a pencil eraser.  Always use sunscreen. Apply sunscreen liberally and repeatedly throughout the day.  Protect yourself by wearing long sleeves, pants, a wide-brimmed hat, and sunglasses whenever you are outside. HEART DISEASE, DIABETES, AND HIGH BLOOD PRESSURE   High blood pressure causes heart disease and increases the risk of stroke. High blood pressure is more likely to develop in:  People who have blood pressure in the high end   of the normal range (130-139/85-89 mm Hg).  People who are overweight or obese.  People who are African American.  If you are 38-23 years of age, have your blood pressure checked every 3-5 years. If you are 61 years of age or older, have your blood pressure checked every year. You should have your blood pressure measured twice--once when you are at a hospital or clinic, and once when you are not at a hospital or clinic. Record the average of the two measurements. To check your blood pressure when you are not at a hospital or clinic, you can use:  An automated blood pressure machine at a pharmacy.  A home blood pressure monitor.  If you are between 45 years and 39 years old, ask your health care provider if you should take aspirin to prevent strokes.  Have regular diabetes screenings. This involves taking a blood sample to check your fasting blood sugar level.  If you are at a normal weight and have a low risk for diabetes,  have this test once every three years after 38 years of age.  If you are overweight and have a high risk for diabetes, consider being tested at a younger age or more often. PREVENTING INFECTION  Hepatitis B  If you have a higher risk for hepatitis B, you should be screened for this virus. You are considered at high risk for hepatitis B if:  You were born in a country where hepatitis B is common. Ask your health care provider which countries are considered high risk.  Your parents were born in a high-risk country, and you have not been immunized against hepatitis B (hepatitis B vaccine).  You have HIV or AIDS.  You use needles to inject street drugs.  You live with someone who has hepatitis B.  You have had sex with someone who has hepatitis B.  You get hemodialysis treatment.  You take certain medicines for conditions, including cancer, organ transplantation, and autoimmune conditions. Hepatitis C  Blood testing is recommended for:  Everyone born from [birth year] through 1963.  Anyone with known risk factors for hepatitis C. Sexually transmitted infections (STIs)  You should be screened for sexually transmitted infections (STIs) including gonorrhea and chlamydia if:  You are sexually active and are younger than 38 years of age.  You are older than 38 years of age and your health care provider tells you that you are at risk for this type of infection.  Your sexual activity has changed since you were last screened and you are at an increased risk for chlamydia or gonorrhea. Ask your health care provider if you are at risk.  If you do not have HIV, but are at risk, it may be recommended that you take a prescription medicine daily to prevent HIV infection. This is called pre-exposure prophylaxis (PrEP). You are considered at risk if:  You are sexually active and do not regularly use condoms or know the HIV status of your partner(s).  You take drugs by injection.  You are sexually  active with a partner who has HIV. Talk with your health care provider about whether you are at high risk of being infected with HIV. If you choose to begin PrEP, you should first be tested for HIV. You should then be tested every 3 months for as long as you are taking PrEP.  PREGNANCY   If you are premenopausal and you may become pregnant, ask your health care provider about preconception counseling.  If you may  become pregnant, take 400 to 800 micrograms (mcg) of folic acid every day.  If you want to prevent pregnancy, talk to your health care provider about birth control (contraception). OSTEOPOROSIS AND MENOPAUSE   Osteoporosis is a disease in which the bones lose minerals and strength with aging. This can result in serious bone fractures. Your risk for osteoporosis can be identified using a bone density scan.  If you are 38 years of age or older, or if you are at risk for osteoporosis and fractures, ask your health care provider if you should be screened.  Ask your health care provider whether you should take a calcium or vitamin D supplement to lower your risk for osteoporosis.  Menopause may have certain physical symptoms and risks.  Hormone replacement therapy may reduce some of these symptoms and risks. Talk to your health care provider about whether hormone replacement therapy is right for you.  HOME CARE INSTRUCTIONS   Schedule regular health, dental, and eye exams.  Stay current with your immunizations.   Do not use any tobacco products including cigarettes, chewing tobacco, or electronic cigarettes.  If you are pregnant, do not drink alcohol.  If you are breastfeeding, limit how much and how often you drink alcohol.  Limit alcohol intake to no more than 1 drink per day for nonpregnant women. One drink equals 12 ounces of beer, 5 ounces of wine, or 1 ounces of hard liquor.  Do not use street drugs.  Do not share needles.  Ask your health care provider for help if  you need support or information about quitting drugs.  Tell your health care provider if you often feel depressed.  Tell your health care provider if you have ever been abused or do not feel safe at home.   This information is not intended to replace advice given to you by your health care provider. Make sure you discuss any questions you have with your health care provider.   Document Released: 11/06/2010 Document Revised: 05/14/2014 Document Reviewed: 03/25/2013 Elsevier Interactive Patient Education [employer].   Menorrhagia Menorrhagia is the medical term for when your menstrual periods are heavy or last longer than usual. With menorrhagia, every period you have may cause enough blood loss and cramping that you are unable to maintain your usual activities. CAUSES  In some cases, the cause of heavy periods is unknown, but a number of conditions may cause menorrhagia. Common causes include:  A problem with the hormone-producing thyroid gland (hypothyroid).  Noncancerous growths in the uterus (polyps or fibroids).  An imbalance of the estrogen and progesterone hormones.  One of your ovaries not releasing an egg during one or more months.  Side effects of having an intrauterine device (IUD).  Side effects of some medicines, such as anti-inflammatory medicines or blood thinners.  A bleeding disorder that stops your blood from clotting normally. SIGNS AND SYMPTOMS  During a normal period, bleeding lasts between 4 and 8 days. Signs that your periods are too heavy include:  You routinely have to change your pad or tampon every 1 or 2 hours because it is completely soaked.  You pass blood clots larger than 1 inch (2.5 cm) in size.  You have bleeding for more than 7 days.  You need to use pads and tampons at the same time because of heavy bleeding.  You need to wake up to change your pads or tampons during the night.  You have symptoms of anemia, such as tiredness,  fatigue, or  shortness of breath. DIAGNOSIS  Your health care provider will perform a physical exam and ask you questions about your symptoms and menstrual history. Other tests may be ordered based on what the health care provider finds during the exam. These tests can include:  Blood tests. Blood tests are used to check if you are pregnant or have hormonal changes, a bleeding or thyroid disorder, low iron levels (anemia), or other problems.  Endometrial biopsy. Your health care provider takes a sample of tissue from the inside of your uterus to be examined under a microscope.  Pelvic ultrasound. This test uses sound waves to make a picture of your uterus, ovaries, and vagina. The pictures can show if you have fibroids or other growths.  Hysteroscopy. For this test, your health care provider will use a small telescope to look inside your uterus. Based on the results of your initial tests, your health care provider may recommend further testing. TREATMENT  Treatment may not be needed. If it is needed, your health care provider may recommend treatment with one or more medicines first. If these do not reduce bleeding enough, a surgical treatment might be an option. The best treatment for you will depend on:   Whether you need to prevent pregnancy.  Your desire to have children in the future.  The cause and severity of your bleeding.  Your opinion and personal preference.  Medicines for menorrhagia may include:  Birth control methods that use hormones. These include the pill, skin patch, vaginal ring, shots that you get every 3 months, hormonal IUD, and implant. These treatments reduce bleeding during your menstrual period.  Medicines that thicken blood and slow bleeding.  Medicines that reduce swelling, such as ibuprofen.  Medicines that contain a synthetic hormone called progestin.   Medicines that make the ovaries stop working for a short time.  You may need surgical treatment  for menorrhagia if the medicines are unsuccessful. Treatment options include:  Dilation and curettage (D&C). In this procedure, your health care provider opens (dilates) your cervix and then scrapes or suctions tissue from the lining of your uterus to reduce menstrual bleeding.  Operative hysteroscopy. This procedure uses a tiny tube with a light (hysteroscope) to view your uterine cavity and can help in the surgical removal of a polyp that may be causing heavy periods.  Endometrial ablation. Through various techniques, your health care provider permanently destroys the entire lining of your uterus (endometrium). After endometrial ablation, most women have little or no menstrual flow. Endometrial ablation reduces your ability to become pregnant.  Endometrial resection. This surgical procedure uses an electrosurgical wire loop to remove the lining of the uterus. This procedure also reduces your ability to become pregnant.  Hysterectomy. Surgical removal of the uterus and cervix is a permanent procedure that stops menstrual periods. Pregnancy is not possible after a hysterectomy. This procedure requires anesthesia and hospitalization. HOME CARE INSTRUCTIONS   Only take over-the-counter or prescription medicines as directed by your health care provider. Take prescribed medicines exactly as directed. Do not change or switch medicines without consulting your health care provider.  Take any prescribed iron pills exactly as directed by your health care provider. Long-term heavy bleeding may result in low iron levels. Iron pills help replace the iron your body lost from heavy bleeding. Iron may cause constipation. If this becomes a problem, increase the bran, fruits, and roughage in your diet.  Do not take aspirin or medicines that contain aspirin 1 week before or during your menstrual  period. Aspirin may make the bleeding worse.  If you need to change your sanitary pad or tampon more than once every 2  hours, stay in bed and rest as much as possible until the bleeding stops.  Eat well-balanced meals. Eat foods high in iron. Examples are leafy green vegetables, meat, liver, eggs, and whole grain breads and cereals. Do not try to lose weight until the abnormal bleeding has stopped and your blood iron level is back to normal. SEEK MEDICAL CARE IF:   You soak through a pad or tampon every 1 or 2 hours, and this happens every time you have a period.  You need to use pads and tampons at the same time because you are bleeding so much.  You need to change your pad or tampon during the night.  You have a period that lasts for more than 8 days.  You pass clots bigger than 1 inch wide.  You have irregular periods that happen more or less often than once a month.  You feel dizzy or faint.  You feel very weak or tired.  You feel short of breath or feel your heart is beating too fast when you exercise.  You have nausea and vomiting or diarrhea while you are taking your medicine.  You have any problems that may be related to the medicine you are taking. SEEK IMMEDIATE MEDICAL CARE IF:   You soak through 4 or more pads or tampons in 2 hours.  You have any bleeding while you are pregnant. MAKE SURE YOU:   Understand these instructions.  Will watch your condition.  Will get help right away if you are not doing well or get worse.   This information is not intended to replace advice given to you by your health care provider. Make sure you discuss any questions you have with your health care provider.   Document Released: 04/23/2005 Document Revised: 04/28/2013 Document Reviewed: 10/12/2012 Elsevier Interactive Patient Education [employer].

## 2015-06-02 NOTE — Progress Notes (Signed)
Patient ID: Natalie Park, female  DOB: 1976-01-07, 40 y.o.   MRN: [id number]  Subjective:  Natalie Park is a 40 y.o. female present for annual exam All past medical history, surgical history, allergies, family history, immunizations, medications and social history were updated in the electronic medical record today. All recent labs, ED visits and hospitalizations within the last year were reviewed.  Irregular menses: Patient states that she has been experiencing irregular menses for the past 6 months. She is having more frequent menstrual cycles, cycles occurring every 20-25 days. She states she is sometimes spotting in between cycles, and is experiencing more heavy cycles that last longer. She states there has medications where she has bled through a tampon within 2 hours. She has a family history of colon cancer in her maternal grandmother at 77. She is also had history of breast cancer in her family, and early screening is indicated for her. Patient is not on any form of birth control, her husband has had a vasectomy. She is married, in a monogamous relationship. She declines HIV testing. She denies any dyspareunia, vaginal irritation or discharge. She states she does perform self breast exams, but not routinely. Patient is a Engineer, maintenance, exercises multiple times a week. Denies any dizziness, chest pain, shortness of breath or syncopal events.  Health maintenance:  Colonoscopy: MGM 30, colon cancer. Screening at 42. Mammogram:MGM breast cancer, SBE, early screen anytime now until 13.  Cervical cancer screening: >2 years, no abnormal. Immunizations: TDAP UTD, declined flu Infectious disease screening: declined HIV DEXA: not indicated Assistive device: None Oxygen QBH:ALPF Patient has a Dental home. Hospitalizations/ED visits: None.  Past Medical History  Diagnosis Date  . Iron deficiency   . GERD (gastroesophageal reflux disease)   . Amnesia, global, transient      occured while receiving fertility treatment   No Known Allergies Past Surgical History  Procedure Laterality Date  . Tonsillectomy and adenoidectomy      Only adenoids. 1983  . Amnesia; short term (12 hour duration)    . Tubes in ears  1982, 1984   Family History  Problem Relation Age of Onset  . Arthritis Father   . Hyperlipidemia Father   . Cancer Maternal Grandmother     Colon, Breast  . Stroke Maternal Grandmother   . Hyperlipidemia Maternal Grandmother   . Stroke Maternal Grandfather   . Hypertension Maternal Grandfather   . Hyperlipidemia Maternal Grandfather   . Arthritis Paternal Grandmother   . Arthritis Paternal Grandfather   . Stroke Paternal Grandfather   . Hypertension Paternal Grandfather   . Hyperlipidemia Paternal Grandfather    Social History   Social History  . Marital Status: Married    Spouse Name: N/A  . Number of Children: 2  . Years of Education: N/A   Occupational History  . personal trainer    Social History Main Topics  . Smoking status: Never Smoker   . Smokeless tobacco: Never Used  . Alcohol Use: 0.6 oz/week    1 Glasses of wine per week  . Drug Use: No  . Sexual Activity: Yes     Comment: Husband with vasectomy   Other Topics Concern  . Not on file   Social History Narrative   Married, [place].Son and daughter.   Bachelors degree. Self employed- travel Corporate investment banker.   Waers her seatbelt. Smoke dectector in the home.    Wears sunscreen.    Exercise > 3 x a  week.    Feels safe in relationships.    ROS: Negative, with the exception of above mentioned in HPI  Objective: BP 96/63 mmHg  Pulse 62  Temp(Src) 98.3 F (36.8 C)  Resp 20  Ht '5\' 2"'$  (1.575 m)  Wt 121 lb 8 oz (55.112 kg)  BMI 22.22 kg/m2  SpO2 99%  LMP 05/15/2015 Gen: Afebrile. No acute distress. Nontoxic in appearance, well-developed, well-nourished, thin, physically fit Caucasian female. Pleasant. HENT: AT. [place]. Bilateral TM visualized and normal  in appearance, normal external auditory canal. MMM, no oral lesions, good dentition. Bilateral nares without erythema or swelling. Throat without erythema, ulcerations or exudates. No Cough on exam, no hoarseness on exam. Eyes:Pupils Equal Round Reactive to light, Extraocular movements intact,  Conjunctiva without redness, discharge or icterus. Neck/lymp/endocrine: Supple, no lymphadenopathy, no thyromegaly CV: RRR no murmurs, clicks, gallops or rubs, no edema, +2/4 P posterior tibialis pulses. No carotid bruits. No JVD. Chest: CTAB, no wheeze, rhonchi or crackles. Normal Respiratory effort. Good Air movement. Abd: Soft. Flat. NTND. BS present. No Masses palpated. No hepatosplenomegaly. No rebound tenderness or guarding. Skin: No rashes, purpura or petechiae. Warm and well-perfused. Skin intact. Neuro/Msk:  Normal gait. PERLA. EOMi. Alert. Oriented x3.  Cranial nerves II through XII intact. Muscle strength 5/5 upper and lower extremity. DTRs equal bilaterally. Psych: Normal affect, dress and demeanor. Normal speech. Normal thought content and judgment.  Breasts: breasts appear normal, symmetrical, no tenderness on exam, no suspicious masses, no skin or nipple changes or axillary nodes. GYN:  External genitalia within normal limits, normal hair distribution, no lesions. Urethral meatus normal, no lesions. Vaginal mucosa pink, moist, normal rugae, no lesions. No cystocele or rectocele. Mildly friable cervix without lesions, thick yellow discharge,  or bleeding noted on speculum exam.  Bimanual exam revealed nongravid uterus.  No bladder/suprapubic fullness, masses or tenderness. No cervical motion tenderness. No adnexal tenderness or fullness. Anus and perineum within normal limits, no lesions.  Assessment/plan: Natalie Park is a 40 y.o. female present for annual exam.  Routine health maintenance - Lipid screening: Fasting lipid panel collected today - Screening for iron deficiency anemia:  CBC with differential - Comp Met (CMET) Colonoscopy: MGM 57, colon cancer. Screening at 74. Mammogram:MGM breast cancer, SBE, early screen anytime now until 33. Patient declined mammogram this year. Cervical cancer screening: >2 years, no abnormal. Routine gynecological exam with Pap smear was completed today with HPV testing. Immunizations: TDAP UTD, declined flu day. Infectious disease screening: declined HIV today.  Irregular menses/Screening for thyroid disorder - Gynecological exam normal today with the exception of friable cervix. Will await lab results and Pap results before proceeding forward. - Consider pelvic ultrasound versus gynecological referral, if TSH is normal. - TSH  Patient was encouraged to exercise greater than 150 minutes a week. Patient was encouraged to choose a diet filled with fresh fruits and vegetables, and lean meats. AVS provided to patient today for education/recommendation on gender specific health and safety maintenance.  - Pending upon lab results, may need follow-up on irregular menses. Return in about 1 year (around 06/01/2016) for CPE.   Howard Pouch, DO [place]

## 2015-06-03 ENCOUNTER — Other Ambulatory Visit: Payer: Self-pay | Admitting: Family Medicine

## 2015-06-03 ENCOUNTER — Telehealth: Payer: Self-pay | Admitting: Family Medicine

## 2015-06-03 DIAGNOSIS — N921 Excessive and frequent menstruation with irregular cycle: Secondary | ICD-10-CM | POA: Insufficient documentation

## 2015-06-03 NOTE — Telephone Encounter (Signed)
Please call pt: - her labs are normal. Her cholesterol is great.  No anemia and thyroid is functioning normal.  - patient will likely need a  Referral to Gynecology for full work up on her   abnormal vaginal bleeding/cycle pattern. I have ordered an [place] to be scheduled to rule out structural reasons for heavy, irregular cycles. (fibroids etc). I have also placed an order for more labs to be drawn to further investigate.  I will need to see her for an OV to review all the above 2-3 days after all completed.  - I am still awaiting pap results. She will be called once it is returned.Marland Kitchen

## 2015-06-03 NOTE — Telephone Encounter (Signed)
Spoke with patient reviewed lab results and instructions. Patient verbalized understanding. 

## 2015-06-06 ENCOUNTER — Ambulatory Visit (HOSPITAL_BASED_OUTPATIENT_CLINIC_OR_DEPARTMENT_OTHER)
Admission: RE | Admit: 2015-06-06 | Discharge: 2015-06-06 | Disposition: A | Discharge status: Home / Self Care | Payer: Managed Care, Other (non HMO) | Source: Ambulatory Visit | Attending: Family Medicine | Admitting: Family Medicine

## 2015-06-06 ENCOUNTER — Telehealth: Payer: Self-pay | Admitting: Family Medicine

## 2015-06-06 DIAGNOSIS — N921 Excessive and frequent menstruation with irregular cycle: Secondary | ICD-10-CM

## 2015-06-06 DIAGNOSIS — N838 Other noninflammatory disorders of ovary, fallopian tube and broad ligament: Secondary | ICD-10-CM | POA: Diagnosis not present

## 2015-06-06 LAB — CYTOLOGY - PAP

## 2015-06-06 NOTE — Telephone Encounter (Signed)
Please call pt: - PAP is normal.  

## 2015-06-06 NOTE — Telephone Encounter (Signed)
Left message with patient results on patient voice mail per DPR.

## 2015-06-07 ENCOUNTER — Telehealth: Payer: Self-pay | Admitting: Family Medicine

## 2015-06-07 NOTE — Telephone Encounter (Signed)
Scheduled patient for lab appt and office visit for test result review.

## 2015-06-07 NOTE — Telephone Encounter (Signed)
Please call pt and make certain she has scheduled her labs appt and follow-up provider appt,so we can review all results including her [place].  - Briefly she did have a right ovarian mass, that appeared to be a cyst. However follow [place] was recommended in 10-12 weeks. I will need to see her to discuss in detail, along with lab results.   - If she would like to follow with gyn for her irregular menses and further eval we can place a referral, otherwise she needs obtain labs and make follow up appt

## 2015-06-13 ENCOUNTER — Other Ambulatory Visit (INDEPENDENT_AMBULATORY_CARE_PROVIDER_SITE_OTHER): Payer: Managed Care, Other (non HMO)

## 2015-06-13 DIAGNOSIS — N921 Excessive and frequent menstruation with irregular cycle: Secondary | ICD-10-CM

## 2015-06-13 LAB — POCT URINE PREGNANCY: Preg Test, Ur: NEGATIVE

## 2015-06-13 LAB — LUTEINIZING HORMONE: LH: 4.5 m[IU]/mL

## 2015-06-13 LAB — FOLLICLE STIMULATING HORMONE: FSH: 12.7 m[IU]/mL

## 2015-06-15 ENCOUNTER — Ambulatory Visit (INDEPENDENT_AMBULATORY_CARE_PROVIDER_SITE_OTHER): Payer: Managed Care, Other (non HMO) | Admitting: Family Medicine

## 2015-06-15 ENCOUNTER — Encounter: Payer: Self-pay | Admitting: Family Medicine

## 2015-06-15 VITALS — BP 107/67 | HR 62 | Temp 98.4°F | Resp 20 | Wt 120.8 lb

## 2015-06-15 DIAGNOSIS — N921 Excessive and frequent menstruation with irregular cycle: Secondary | ICD-10-CM | POA: Diagnosis not present

## 2015-06-15 DIAGNOSIS — N839 Noninflammatory disorder of ovary, fallopian tube and broad ligament, unspecified: Secondary | ICD-10-CM

## 2015-06-15 DIAGNOSIS — N838 Other noninflammatory disorders of ovary, fallopian tube and broad ligament: Secondary | ICD-10-CM | POA: Insufficient documentation

## 2015-06-15 LAB — ESTRADIOL: Estradiol: 43 pg/mL

## 2015-06-15 NOTE — Patient Instructions (Signed)
Ovarian Cyst An ovarian cyst is a fluid-filled sac that forms on an ovary. The ovaries are small organs that produce eggs in women. Various types of cysts can form on the ovaries. Most are not cancerous. Many do not cause problems, and they often go away on their own. Some may cause symptoms and require treatment. Common types of ovarian cysts include:  Functional cysts--These cysts may occur every month during the menstrual cycle. This is normal. The cysts usually go away with the next menstrual cycle if the woman does not get pregnant. Usually, there are no symptoms with a functional cyst.  Endometrioma cysts--These cysts form from the tissue that lines the uterus. They are also called "chocolate cysts" because they become filled with blood that turns brown. This type of cyst can cause pain in the lower abdomen during intercourse and with your menstrual period.  Cystadenoma cysts--This type develops from the cells on the outside of the ovary. These cysts can get very big and cause lower abdomen pain and pain with intercourse. This type of cyst can twist on itself, cut off its blood supply, and cause severe pain. It can also easily rupture and cause a lot of pain.  Dermoid cysts--This type of cyst is sometimes found in both ovaries. These cysts may contain different kinds of body tissue, such as skin, teeth, hair, or cartilage. They usually do not cause symptoms unless they get very big.  Theca lutein cysts--These cysts occur when too much of a certain hormone (human chorionic gonadotropin) is produced and overstimulates the ovaries to produce an egg. This is most common after procedures used to assist with the conception of a baby (in vitro fertilization). CAUSES   Fertility drugs can cause a condition in which multiple large cysts are formed on the ovaries. This is called ovarian hyperstimulation syndrome.  A condition called polycystic ovary syndrome can cause hormonal imbalances that can lead to  nonfunctional ovarian cysts. SIGNS AND SYMPTOMS  Many ovarian cysts do not cause symptoms. If symptoms are present, they may include:  Pelvic pain or pressure.  Pain in the lower abdomen.  Pain during sexual intercourse.  Increasing girth (swelling) of the abdomen.  Abnormal menstrual periods.  Increasing pain with menstrual periods.  Stopping having menstrual periods without being pregnant. DIAGNOSIS  These cysts are commonly found during a routine or annual pelvic exam. Tests may be ordered to find out more about the cyst. These tests may include:  Ultrasound.  X-ray of the pelvis.  CT scan.  MRI.  Blood tests. TREATMENT  Many ovarian cysts go away on their own without treatment. Your health care provider may want to check your cyst regularly for 2-3 months to see if it changes. For women in menopause, it is particularly important to monitor a cyst closely because of the higher rate of ovarian cancer in menopausal women. When treatment is needed, it may include any of the following:  A procedure to drain the cyst (aspiration). This may be done using a long needle and ultrasound. It can also be done through a laparoscopic procedure. This involves using a thin, lighted tube with a tiny camera on the end (laparoscope) inserted through a small incision.  Surgery to remove the whole cyst. This may be done using laparoscopic surgery or an open surgery involving a larger incision in the lower abdomen.  Hormone treatment or birth control pills. These methods are sometimes used to help dissolve a cyst. HOME CARE INSTRUCTIONS   Only take over-the-counter   or prescription medicines as directed by your health care provider.  Follow up with your health care provider as directed.  Get regular pelvic exams and Pap tests. SEEK MEDICAL CARE IF:   Your periods are late, irregular, or painful, or they stop.  Your pelvic pain or abdominal pain does not go away.  Your abdomen becomes  larger or swollen.  You have pressure on your bladder or trouble emptying your bladder completely.  You have pain during sexual intercourse.  You have feelings of fullness, pressure, or discomfort in your stomach.  You lose weight for no apparent reason.  You feel generally ill.  You become constipated.  You lose your appetite.  You develop acne.  You have an increase in body and facial hair.  You are gaining weight, without changing your exercise and eating habits.  You think you are pregnant. SEEK IMMEDIATE MEDICAL CARE IF:   You have increasing abdominal pain.  You feel sick to your stomach (nauseous), and you throw up (vomit).  You develop a fever that comes on suddenly.  You have abdominal pain during a bowel movement.  Your menstrual periods become heavier than usual. MAKE SURE YOU:  Understand these instructions.  Will watch your condition.  Will get help right away if you are not doing well or get worse.   This information is not intended to replace advice given to you by your health care provider. Make sure you discuss any questions you have with your health care provider.   Document Released: 04/23/2005 Document Revised: 04/28/2013 Document Reviewed: 12/29/2012 Elsevier Interactive Patient Education 2016 Elsevier Inc.  

## 2015-06-15 NOTE — Progress Notes (Signed)
Patient ID: Natalie Park, female   DOB: 11-21-1975, 40 y.o.   MRN: [id number]      Patient ID: Natalie Park, female  DOB: 1976-03-04, 40 y.o.   MRN: [id number]  Subjective:  Natalie Park is a 40 y.o. female present for follow-up on irregular menses and abnormal ultrasound.  26 day cycle.   Irregular menses/abnormal ultrasound: Prior to last appointment 2 weeks ago patient stated she was  experiencing irregular menses for the past 6 months. She is having more frequent menstrual cycles, cycles occurring every 20-25 days. She states she is sometimes spotting in between cycles, and is experiencing more heavy cycles that last longer. Reports her last cycle started on day 26, and that was the longest cycle she's had in some time. She states there has been times where she has bled through a tampon within 2 hours. She has a family history of colon cancer in her maternal grandmother at 69. She is also had history of breast cancer in her family. Patient is not on any form of birth control, nor has desire. Her husband has had a vasectomy. She has had a negative pregnancy test in the office.  She denies any dyspareunia, vaginal irritation or discharge. Patient is a Control and instrumentation engineer, exercises multiple times a week. Denies any dizziness, chest pain, shortness of breath or syncopal events. CBC, CMP, TSH, LH, FSH, estradiol and pregnancy test were all within normal ranges. Pelvic ultrasound resulted with a right ovarian mass 2.11.6 cm described as mixed echogenicity, associated with surrounding vascularity.   Of note: Patient states she has also within the last week, on day 20 of her cycle had her DHEAS, progesterone and estradiol by saliva tested and it was low (morning collection). Her cortisol and testosterone was reportedly slightly above normal. Uncertain location of this testing, and no results were provided.  Ultrasound 06/06/2015. IMPRESSION: 1. Mixed-echogenicity mass within the  right ovary measuring 2.1 x 1.6 cm, with associated and/or surrounding vascularity. This is of uncertain etiology or significance at this point, perhaps merely a recently collapsed cyst. Recommend follow-up pelvic ultrasound in 10-12 weeks. 2. 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 Uterus otherwise unremarkable. No endometrial thickening. No mass or fluid within the endometrial canal. 3. Left ovary appears normal. No mass or free fluid within the left adnexal region.    Past Medical History  Diagnosis Date  . Iron deficiency   . GERD (gastroesophageal reflux disease)   . Amnesia, global, transient     occured while receiving fertility treatment   No Known Allergies Past Surgical History  Procedure Laterality Date  . Tonsillectomy and adenoidectomy      Only adenoids. 1983  . Amnesia; short term (12 hour duration)    . Tubes in ears  1982, 1984   Family History  Problem Relation Age of Onset  . Arthritis Father   . Hyperlipidemia Father   . Cancer Maternal Grandmother     Colon, Breast  . Stroke Maternal Grandmother   . Hyperlipidemia Maternal Grandmother   . Stroke Maternal Grandfather   . Hypertension Maternal Grandfather   . Hyperlipidemia Maternal Grandfather   . Arthritis Paternal Grandmother   . Arthritis Paternal Grandfather   . Stroke Paternal Grandfather   . Hypertension Paternal Grandfather   . Hyperlipidemia Paternal Grandfather    Social History   Social History  . Marital Status: Married    Spouse Name: N/A  . Number of Children: 2  . Years of Education: N/A  Occupational History  . personal trainer    Social History Main Topics  . Smoking status: Never Smoker   . Smokeless tobacco: Never Used  . Alcohol Use: 0.6 oz/week    1 Glasses of wine per week  . Drug Use: No  . Sexual Activity: Yes     Comment: Husband with vasectomy   Other Topics Concern  . Not on file   Social History Narrative   Married, [place].Son and daughter.     Bachelors degree. 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   Waers her seatbelt. Smoke dectector in the home.    Wears sunscreen.    Exercise > 3 x a week.    Feels safe in relationships.    ROS: Negative, with the exception of above mentioned in HPI  Objective: BP 107/67 mmHg  Pulse 62  Temp(Src) 98.4 F (36.9 C)  Resp 20  Wt 120 lb 12 oz (54.772 kg)  SpO2 97%  LMP 06/10/2015 Gen: Afebrile. No acute distress. Nontoxic in appearance, well-developed, well-nourished, thin, physically fit Caucasian female. Pleasant. HENT: AT. [place]. MMM, no oral lesions Eyes:Pupils Equal Round Reactive to light, Extraocular movements intact,  Conjunctiva without redness, discharge or icterus. CV: RRR  Chest: CTAB Abd: Soft. Flat. NTND. BS present. No Masses palpated. No hepatosplenomegaly. No rebound tenderness or guarding.  Assessment/plan: Natalie Park is a 40 y.o. female present for irregular menses, review of all lab work and imaging studies, secondary to abnormal ultrasound. Irregular menses/abnormal pelvic ultrasound - Patient has no tenderness on exam. - TSH was normal. Pap smear with adequate collection and normal 06/02/2015. HPV co-testing was not completed. FSH, LH and estradiol were within normal range. Patient is seeing someone who is testing her progesterone, testosterone, estradiol, DHEAS (by saliva) and will continue following with them if she desires for evaluation. Discussed with patient today I would like to refer her to a gynecologist, which can further evaluate her irregular menses and the right ovarian mass. Patient is amendable to referral today. - Gynecology referral today. - Future follow-up transvaginal non-OB ultrasound to be completed 10-12 weeks on ovarian cyst has been placed in the event she is unable to get in to see a gynecologist prior. Discussed with her that she may have to change and plan depending upon her gynecologist and may have ultrasound  completed at her gynecologist office.   > 25 minutes spent with patient, >50% of time spent face to face counseling patient and coordinating care.    Felix Pacini, DO [facility]

## 2015-06-16 ENCOUNTER — Other Ambulatory Visit: Payer: Self-pay | Admitting: Family Medicine

## 2015-06-16 ENCOUNTER — Telehealth: Payer: Self-pay | Admitting: Obstetrics and Gynecology

## 2015-06-16 DIAGNOSIS — N838 Other noninflammatory disorders of ovary, fallopian tube and broad ligament: Secondary | ICD-10-CM

## 2015-06-16 NOTE — Telephone Encounter (Signed)
Called and left a message for patient to call back to schedule a new patient doctor referral. °

## 2015-06-17 ENCOUNTER — Telehealth: Payer: Self-pay | Admitting: Family Medicine

## 2015-06-17 NOTE — Telephone Encounter (Signed)
Amber from [facility] is calling in regards to a referral. Amber that they have an appointment on Feb 16th afternoon starting at 3 and that can change. Amber stated that it was needed before the 16th would the MD be ok with it being on the 16th.

## 2015-06-17 NOTE — Telephone Encounter (Signed)
Called [facility] spoke with Altamease Oiler let her know [place] should not be scheduled before 08/22/14 as indicated on order.

## 2015-06-23 ENCOUNTER — Ambulatory Visit: Payer: Managed Care, Other (non HMO) | Admitting: Obstetrics and Gynecology

## 2015-06-30 ENCOUNTER — Ambulatory Visit (INDEPENDENT_AMBULATORY_CARE_PROVIDER_SITE_OTHER): Payer: Managed Care, Other (non HMO) | Admitting: Obstetrics and Gynecology

## 2015-06-30 ENCOUNTER — Encounter: Payer: Self-pay | Admitting: Obstetrics and Gynecology

## 2015-06-30 VITALS — BP 98/60 | HR 72 | Resp 14 | Wt 119.0 lb

## 2015-06-30 DIAGNOSIS — N838 Other noninflammatory disorders of ovary, fallopian tube and broad ligament: Secondary | ICD-10-CM

## 2015-06-30 DIAGNOSIS — N839 Noninflammatory disorder of ovary, fallopian tube and broad ligament, unspecified: Secondary | ICD-10-CM | POA: Diagnosis not present

## 2015-06-30 DIAGNOSIS — N939 Abnormal uterine and vaginal bleeding, unspecified: Secondary | ICD-10-CM

## 2015-06-30 NOTE — Progress Notes (Signed)
Patient ID: Natalie Park, female   DOB: 05-May-1976, 40 y.o.   MRN: [id number] 40 y.o. [id number] MarriedCaucasianF referred for a consultation by Felix Pacini, DO for abnormal uterine bleeding and a right ovarian mass.  Period Duration (Days): 7-10 days  Period Pattern: (!) Irregular Menstrual Flow: Moderate, Heavy Menstrual Control: Tampon Menstrual Control Change Freq (Hours): changes super tampon every 2 hours on heavy days  Dysmenorrhea: (!) Moderate Dysmenorrhea Symptoms: Cramping  Menses are q 20-25 days x 7-10 days, changing a super tampon in 2 hours. She has been this heavy for the last 6-8 months. She has had some bleeding in between her cycles, not sure when in her cycles. Cramps vary from mild to moderate. Advil takes the edge off.  Sexually active, no pain with intercourse, sometimes feels pressure in the front. No postcoital bleeding.  Normal TSH, normal CBC, normal pap, normal CMP U/S suggests a bicornuate uterus. Normal left ovary, right ovary with a 2.1 mm mass with vascularity, possibly just a collapsed cyst.  She has tried an IUD in the past, expulsed it.   Patient's last menstrual period was 06/10/2015.          Sexually active: Yes.    The current method of family planning is vasectomy.    Exercising: Yes.    weights jog intervals Smoker:  no  Health Maintenance: Pap:  06-02-15 WNL History of abnormal Pap:  no MMG:  N/A Colonoscopy:  N/A BMD:   N/A TDaP: 2016 Gardasil: N/A   reports that she has never smoked. She has never used smokeless tobacco. She reports that she drinks about 0.6 oz of alcohol per week. She reports that she does not use illicit drugs.She is a travel agent and Systems analyst. Kids are 9 and 17 (girl/boy)  Past Medical History  Diagnosis Date  . Iron deficiency   . GERD (gastroesophageal reflux disease)   . Amnesia, global, transient     occured while receiving fertility treatment  . Anemia   . Anxiety   . Hormone disorder      Past Surgical History  Procedure Laterality Date  . Tonsillectomy and adenoidectomy      Only adenoids. 1983  . Amnesia; short term (12 hour duration)    . Tubes in ears  1982, 1984    Current Outpatient Prescriptions  Medication Sig Dispense Refill  . Multiple Vitamin (MULTIVITAMIN) tablet Take 1 tablet by mouth daily.     No current facility-administered medications for this visit.    Family History  Problem Relation Age of Onset  . Arthritis Father   . Hyperlipidemia Father   . Cancer Maternal Grandmother     Colon, Breast  . Stroke Maternal Grandmother   . Hyperlipidemia Maternal Grandmother   . Stroke Maternal Grandfather   . Hypertension Maternal Grandfather   . Hyperlipidemia Maternal Grandfather   . Arthritis Paternal Grandmother   . Arthritis Paternal Grandfather   . Stroke Paternal Grandfather   . Hypertension Paternal Grandfather   . Hyperlipidemia Paternal Grandfather     Review of Systems  Constitutional: Negative.   HENT: Negative.   Eyes: Negative.   Respiratory: Negative.   Cardiovascular: Negative.   Gastrointestinal: Negative.   Endocrine: Negative.   Genitourinary: Positive for menstrual problem.       Irregular menstrual bleeding Painful periods  Musculoskeletal: Negative.   Skin: Negative.   Allergic/Immunologic: Negative.   Neurological: Negative.   Psychiatric/Behavioral: Negative.     Exam:   BP 98/60 mmHg  Pulse 72  Resp 14  Wt 119 lb (53.978 kg)  LMP 06/10/2015  Weight change: @ Height:      Ht Readings from Last 3 Encounters:  06/02/15  (1.575 m)  04/16/14  (1.575 m)  03/05/13  (1.575 m)    General appearance: alert, cooperative and appears stated age Head: Normocephalic, without obvious abnormality, atraumatic Neck: no adenopathy, supple, symmetrical, trachea midline and thyroid normal to inspection and palpation Lungs: clear to auscultation bilaterally Heart: regular rate and  rhythm Abdomen: soft, non-tender; bowel sounds normal; no masses,  no organomegaly Extremities: extremities normal, atraumatic, no cyanosis or edema Skin: Skin color, texture, turgor normal. No rashes or lesions Lymph nodes: Cervical, supraclavicular, and axillary nodes normal. No abnormal inguinal nodes palpated Neurologic: Grossly normal   Pelvic: External genitalia:  no lesions              Urethra:  normal appearing urethra with no masses, tenderness or lesions              Bartholins and Skenes: normal                 Vagina: normal appearing vagina with normal color and discharge, no lesions              Cervix: no lesions               Bimanual Exam:  Uterus:  normal size, contour, position, consistency, mobility, non-tender and anteverted              Adnexa: no mass, fullness, tenderness               Rectovaginal: Confirms               Anus:  normal sphincter tone, no lesions  Chaperone was present for exam.  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 She has no contraindications to OCP's, risks reviewed  She prefers w/u  Will have her return for an ultrasound here with 3D, possible sonohysterogram, possible endometrial biopsy  She has no plans for future pregnancies so the bicornuate uterus isn't an issue from that standpoint. It could be why she expulsed her IUD  Discussed that uterine anomalies increase the risk of renal abnormalities and she should have renal imaging with her primary (IVP or renal ultrasound)   CC: Dr Felix Pacini Letter sent

## 2015-07-01 ENCOUNTER — Telehealth: Payer: Self-pay | Admitting: Obstetrics and Gynecology

## 2015-07-01 NOTE — Telephone Encounter (Signed)
Called patient to review benefits for a recommended procedure. Left Voicemail requesting a call back. °

## 2015-07-13 NOTE — Telephone Encounter (Signed)
Called patient to review benefits for a recommended procedure. Left Voicemail requesting a call back. °

## 2015-07-19 NOTE — Telephone Encounter (Signed)
Spoke with patient regarding benefits for recommended procedure. Pt advises she does not want to proceed at this time. Forwarding to Clinical Triage to review.

## 2015-07-22 NOTE — Telephone Encounter (Signed)
She certainly can just return for an ultrasound. Irregardless of her abnormal bleeding, she should have an ultrasound to f/u on the ovarian cyst. After the u/s we can further discuss options for her abnormal bleeding

## 2015-07-22 NOTE — Telephone Encounter (Signed)
Dr. Oscar LaJertson,  Can you please review.  Okay to have patient return for Pelvic ultrasound only at this time?  Needs letter if declines scheduling follow up imaging?

## 2015-07-26 NOTE — Telephone Encounter (Signed)
Please advise the patient that her bleeding is not normal. She needs a f/u ultrasound for the ovarian cyst that was seen on her last ultrasound. The radiologist thought it might be a collapsing cyst, but there was some vascularity, so he wasn't sure.  If she declines w/u for her AUB, she should consider a 3 month trial of OCP's to see if her bleeding improves (if it does it implies a hormonal cause of her bleeding). I don't want her to fall through the cracks.

## 2015-07-26 NOTE — Telephone Encounter (Signed)
Conveyed information to patient regarding ultrasound and benefits for an ultrasound. Patient advises she will "think about it and call back if I decide to schedule". Routing to provider for review

## 2015-07-27 NOTE — Telephone Encounter (Signed)
Call to patient, left message to call back. 

## 2015-08-15 NOTE — Telephone Encounter (Signed)
Call to patient, left message to call back. 

## 2015-09-07 NOTE — Telephone Encounter (Signed)
Dr Oscar LaJertson, No patient response from patient despite the multiple attempts. Can we discuss how you want to proceed?

## 2015-09-11 NOTE — Telephone Encounter (Signed)
Please send a letter to the patient. Her bleeding is not normal and could represent pathology that was not seen on the ultrasound. I would recommend sonohysterogram and likely endometrial biopsy. If she prefers a trial of OCP's, this is an option. If it's simply hormonal it should improve.  I strongly recommend she have an ultrasound for the ovarian cyst that was seen on her ultrasound in 1/17, it could simply have been a collapsing cyst, but abnormal blood flow was seen and it could be something more significant. An ovarian tumor could not be ruled out.  I would send the letter certified mail and close the encounter.

## 2015-09-22 ENCOUNTER — Encounter: Payer: Self-pay | Admitting: Emergency Medicine

## 2015-09-26 NOTE — Telephone Encounter (Signed)
Letter on your desk for review.

## 2015-09-27 NOTE — Telephone Encounter (Signed)
Letter reviewed and signed by Dr Jertson. Mailed certified and regular [place] mail.   Encounter closed.  

## 2015-10-11 ENCOUNTER — Telehealth: Payer: Self-pay | Admitting: Obstetrics and Gynecology

## 2015-10-11 NOTE — Telephone Encounter (Signed)
Left message to call Teyanna Thielman at [phone]. 

## 2015-10-11 NOTE — Telephone Encounter (Signed)
Patient states she needs to have a follow up ultrasound.

## 2015-10-11 NOTE — Telephone Encounter (Signed)
Message left to return call to [place] at [phone].  At patient return call, please schedule transvaginal ultrasound with Dr. Oscar LaJertson. Order is in place.

## 2015-10-12 NOTE — Telephone Encounter (Signed)
Left message requesting a return call in order to schedule ultasound

## 2015-10-14 NOTE — Telephone Encounter (Signed)
Patient is returning a call to Suzy. °

## 2015-10-19 NOTE — Telephone Encounter (Signed)
Patient is scheduled for PUS on 10/25/2015 at 3 pm with 3:30 pm consult with Dr.Jertson.  Routing to provider for final review. Patient agreeable to disposition. Will close encounter.

## 2015-10-21 ENCOUNTER — Telehealth: Payer: Self-pay

## 2015-10-21 DIAGNOSIS — N939 Abnormal uterine and vaginal bleeding, unspecified: Secondary | ICD-10-CM

## 2015-10-21 DIAGNOSIS — N838 Other noninflammatory disorders of ovary, fallopian tube and broad ligament: Secondary | ICD-10-CM

## 2015-10-21 NOTE — Telephone Encounter (Signed)
Order placed for patient to be seen in the office for PUS on 10/25/2015 with Dr.Jertson for evaluation of abnormal uterine bleeding and right ovarian mass. Order has been linked to the patient's appointment.  Cc: Harland DingwallSuzy Dixon  Routing to provider for final review. Patient agreeable to disposition. Will close encounter.

## 2015-10-25 ENCOUNTER — Encounter: Payer: Self-pay | Admitting: Obstetrics and Gynecology

## 2015-10-25 ENCOUNTER — Ambulatory Visit (INDEPENDENT_AMBULATORY_CARE_PROVIDER_SITE_OTHER): Payer: Managed Care, Other (non HMO)

## 2015-10-25 ENCOUNTER — Ambulatory Visit (INDEPENDENT_AMBULATORY_CARE_PROVIDER_SITE_OTHER): Payer: Managed Care, Other (non HMO) | Admitting: Obstetrics and Gynecology

## 2015-10-25 VITALS — BP 92/70 | HR 64 | Resp 14 | Wt 120.0 lb

## 2015-10-25 DIAGNOSIS — N83209 Unspecified ovarian cyst, unspecified side: Secondary | ICD-10-CM

## 2015-10-25 DIAGNOSIS — N939 Abnormal uterine and vaginal bleeding, unspecified: Secondary | ICD-10-CM

## 2015-10-25 DIAGNOSIS — N920 Excessive and frequent menstruation with regular cycle: Secondary | ICD-10-CM | POA: Diagnosis not present

## 2015-10-25 DIAGNOSIS — N838 Other noninflammatory disorders of ovary, fallopian tube and broad ligament: Secondary | ICD-10-CM

## 2015-10-25 DIAGNOSIS — N839 Noninflammatory disorder of ovary, fallopian tube and broad ligament, unspecified: Secondary | ICD-10-CM

## 2015-10-25 NOTE — Progress Notes (Signed)
GYNECOLOGY  VISIT   HPI: 40 y.o.   Married  Caucasian  female   G2P2002 with Patient's last menstrual period was 10/23/2015.   here for f/u ultrasound for h/o ovarian cyst.   The patient is cycling every 23-25 days, occasionally 28 days. Bleeds x 7-10 days (over the last 6-12 months), at the most she saturates a tampon in 2 hours. Not bleeding in between her cycles.  Normal TSH, normal CBC, normal pap, normal CMP  GYNECOLOGIC HISTORY: Patient's last menstrual period was 10/23/2015. Contraception: Vasectomy  Menopausal hormone therapy: none         OB History    Gravida Para Term Preterm AB TAB SAB Ectopic Multiple Living   2 2 2       2          Patient Active Problem List   Diagnosis Date Noted  . Ovarian mass, right 06/15/2015  . Menometrorrhagia 06/03/2015    Past Medical History  Diagnosis Date  . Iron deficiency   . GERD (gastroesophageal reflux disease)   . Amnesia, global, transient     occured while receiving fertility treatment  . Anemia   . Anxiety   . Hormone disorder     Past Surgical History  Procedure Laterality Date  . Tonsillectomy and adenoidectomy      Only adenoids. 1983  . Amnesia; short term (12 hour duration)    . Tubes in ears  1982, 1984    Current Outpatient Prescriptions  Medication Sig Dispense Refill  . Multiple Vitamin (MULTIVITAMIN) tablet Take 1 tablet by mouth daily.     No current facility-administered medications for this visit.     ALLERGIES: Review of patient's allergies indicates no known allergies.  Family History  Problem Relation Age of Onset  . Arthritis Father   . Hyperlipidemia Father   . Cancer Maternal Grandmother     Colon, Breast  . Stroke Maternal Grandmother   . Hyperlipidemia Maternal Grandmother   . Stroke Maternal Grandfather   . Hypertension Maternal Grandfather   . Hyperlipidemia Maternal Grandfather   . Arthritis Paternal Grandmother   . Arthritis Paternal Grandfather   . Stroke  Paternal Grandfather   . Hypertension Paternal Grandfather   . Hyperlipidemia Paternal Grandfather     Social History   Social History  . Marital Status: Married    Spouse Name: N/A  . Number of Children: 2  . Years of Education: N/A   Occupational History  . personal trainer    Social History Main Topics  . Smoking status: Never Smoker   . Smokeless tobacco: Never Used  . Alcohol Use: 0.6 oz/week    1 Glasses of wine per week  . Drug Use: No  . Sexual Activity:    Partners: Male     Comment: Husband with vasectomy   Other Topics Concern  . Not on file   Social History Narrative   Married, [place].Son and daughter.   Bachelors degree. 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   Waers her seatbelt. Smoke dectector in the home.    Wears sunscreen.    Exercise > 3 x a week.    Feels safe in relationships.     Review of Systems  Constitutional: Negative.   HENT: Negative.   Eyes: Negative.   Respiratory: Negative.   Cardiovascular: Negative.   Gastrointestinal: Negative.   Genitourinary: Negative.   Musculoskeletal: Negative.   Skin: Negative.   Neurological: Negative.   Endo/Heme/Allergies:  Negative.   Psychiatric/Behavioral: Negative.     PHYSICAL EXAMINATION:    BP 92/70 mmHg  Pulse 64  Resp 14  Wt 120 lb (54.432 kg)  LMP 10/23/2015    General appearance: alert, cooperative and appears stated age  Images reviewed with the patient  ASSESSMENT Menorrhagia, normal CBC, TSH, Pap. No findings to explain menorrhagia on ultrasound, she declines endometrial biopsy ? Uterine anomaly on prior imaging, 3 D imaging today without a uterine anomaly Ovarian cyst has resolved    PLAN Declines trial of OCP's or endometrial biopsy She will call if she has more cycle changes No further ultrasound needed   An After Visit Summary was printed and given to the patient.

## 2017-01-17 IMAGING — US US PELVIS COMPLETE
1 series · 13 of 25 positions shown · non-contrast
Comparison: None

CLINICAL DATA: Prolonged menses for the past 6-8 months.

EXAM:
TRANSABDOMINAL AND TRANSVAGINAL ULTRASOUND OF PELVIS
TECHNIQUE: Both transabdominal and transvaginal ultrasound examinations of the
pelvis were performed. Transabdominal technique was performed for
global imaging of the pelvis including uterus, ovaries, adnexal
regions, and pelvic cul-de-sac. It was necessary to proceed with
endovaginal exam following the transabdominal exam to visualize the
endometrium to an adequate degree.

[Series 1: us pelvis complete · 0.24mm/px · 13 of 55 slices shown]
[im 1/55]
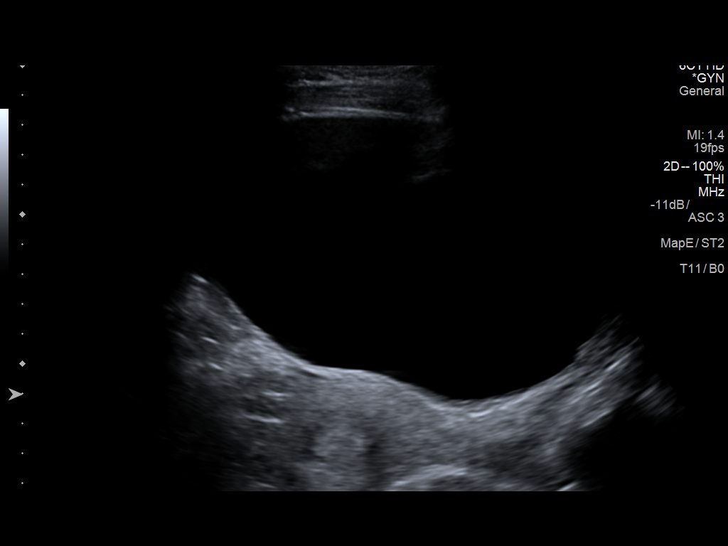
[im 5/55]
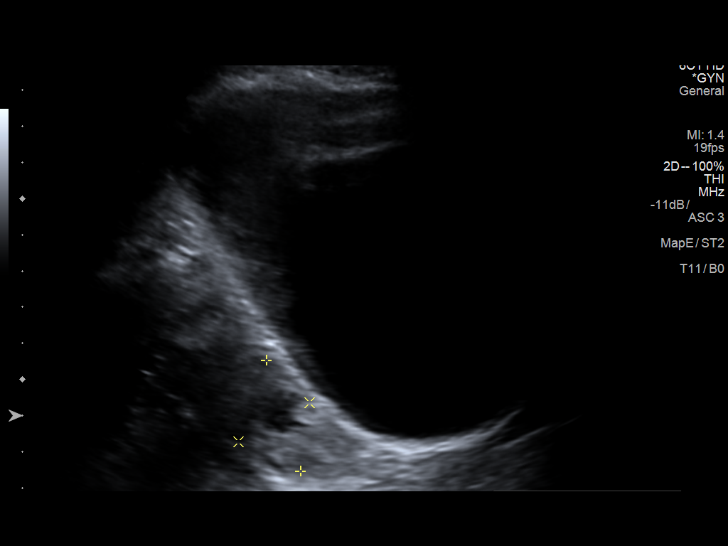
[im 10/55]
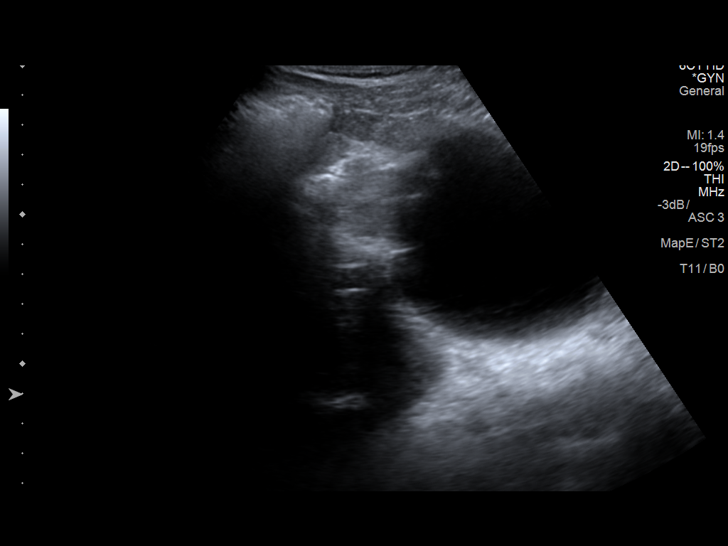
[im 14/55]
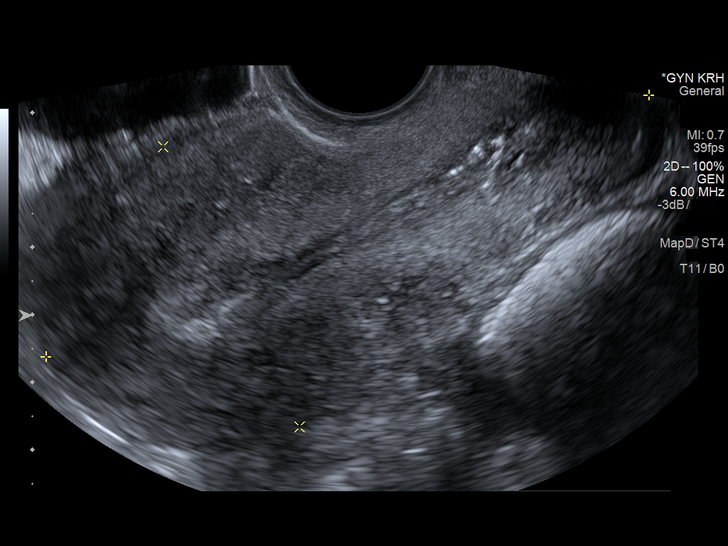
[im 19/55]
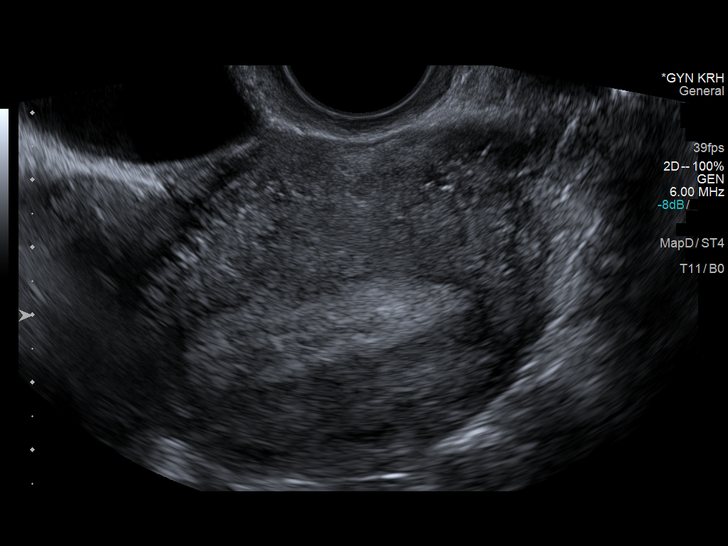
[im 23/55]
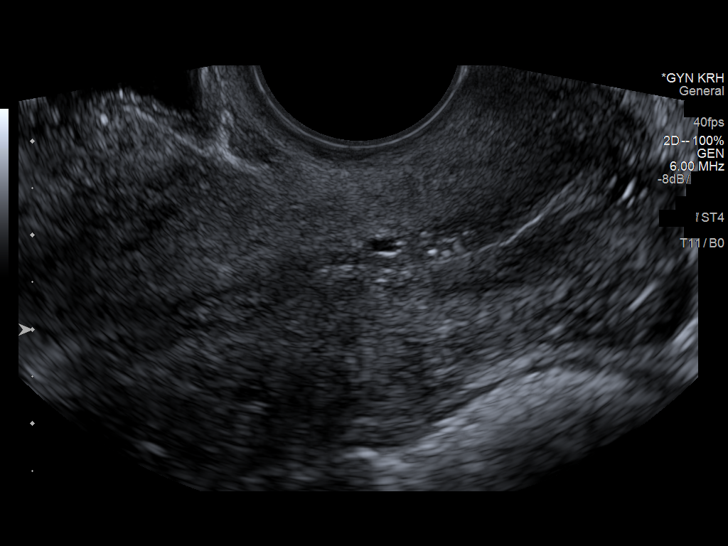
[im 28/55]
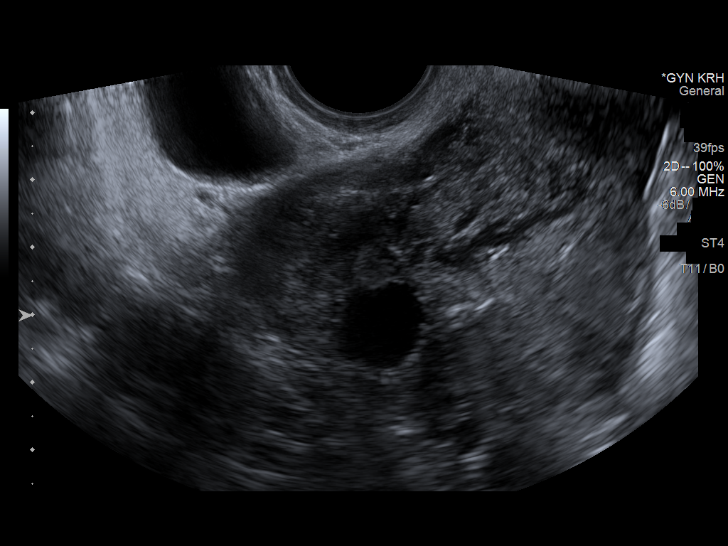
[im 32/55]
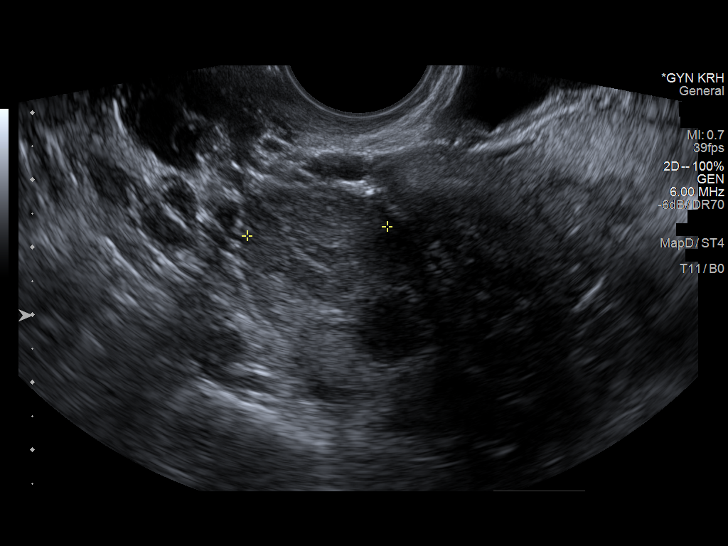
[im 37/55]
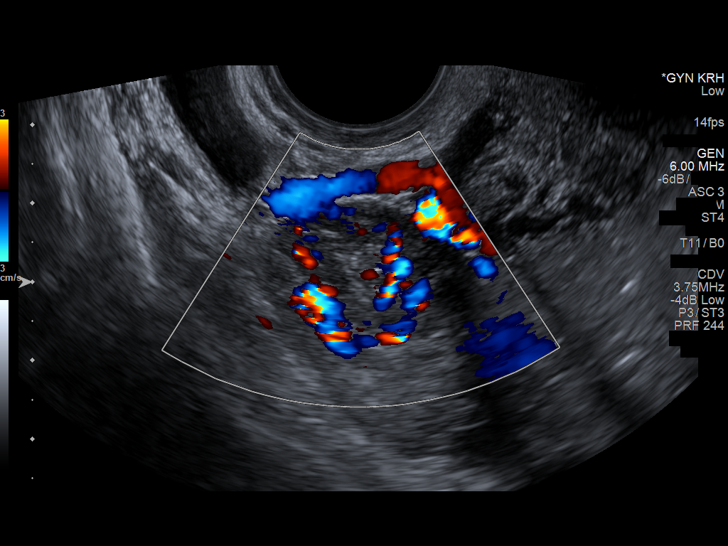
[im 41/55]
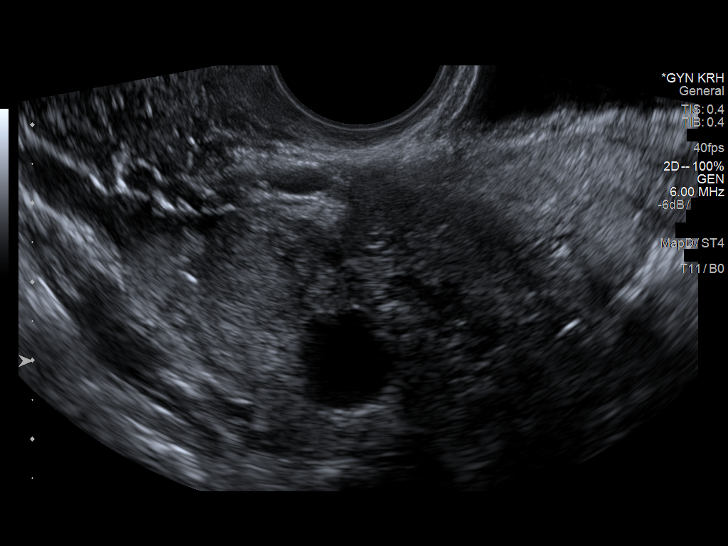
[im 46/55]
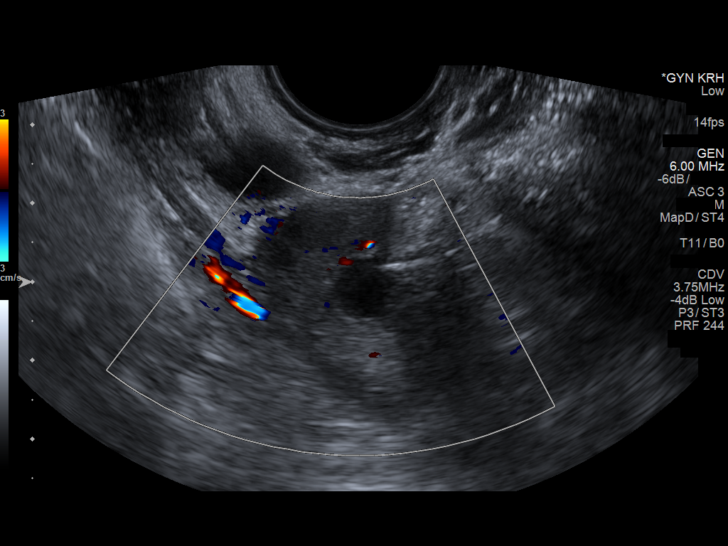
[im 50/55]
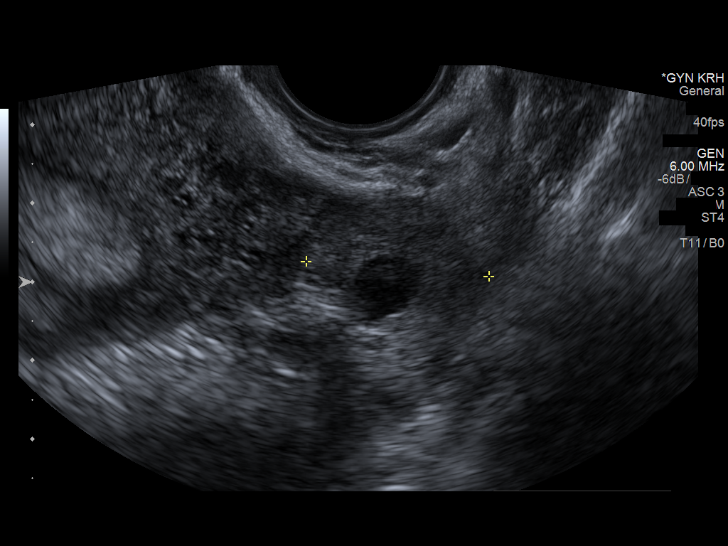
[im 55/55]
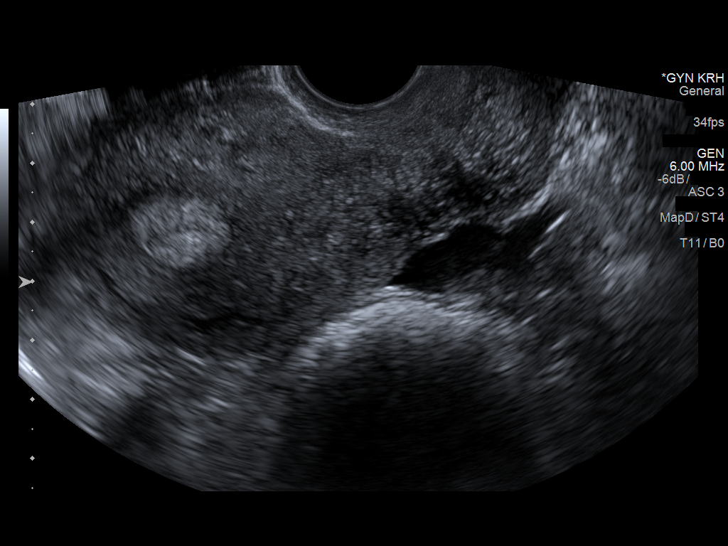

[13 of 25 positions shown; findings below may reference images not displayed]

FINDINGS: Uterus

Measurements: 9.7 x 4.6 x 6.6 cm. Configuration suggests bicornuate
uterus. Endometrium within normal limits in thickness throughout. No
mass or fluid identified within the endometrial canal.

Endometrium

Thickness: 11 mm.  No focal abnormality visualized.

Right ovary

Measurements: 3.9 x 2.3 x 3.2 cm. Mixed-echogenicity mass within the
right ovary measures 2.1 x 2 x 1.6 cm, with vascularity. Additional
anechoic right ovarian simple cyst measures 1.3 x 1.3 cm.

Left ovary

Measurements: 2.9 x 1.9 x 2.3 cm. Normal appearance/no adnexal mass.

Other findings

Small amount of free fluid in the cul-de-sac region is likely
physiologic in nature.
IMPRESSION: 1. Mixed-echogenicity mass within the right ovary measuring 2.1 x
1.6 cm, with associated and/or surrounding vascularity. This is of
uncertain etiology or significance at this point, perhaps merely a
recently collapsed cyst. Recommend follow-up pelvic ultrasound in
10-12 weeks.
2. Probable bicornuate configuration of the uterus. Uterus otherwise
unremarkable. No endometrial thickening. No mass or fluid within the
endometrial canal.
3. Left ovary appears normal. No mass or free fluid within the left
adnexal region.

## 2021-08-15 ENCOUNTER — Other Ambulatory Visit (HOSPITAL_BASED_OUTPATIENT_CLINIC_OR_DEPARTMENT_OTHER): Payer: Self-pay

## 2021-09-27 ENCOUNTER — Other Ambulatory Visit (HOSPITAL_COMMUNITY): Payer: Self-pay
